# Patient Record
Sex: Male | Born: 1970 | Race: White | Hispanic: No | Marital: Married | Smoking: Never smoker
Health system: Southern US, Community
[De-identification: ages and names within clinical notes are randomized; demographics above are authoritative.]

## PROBLEM LIST (undated history)

## (undated) DIAGNOSIS — G4730 Sleep apnea, unspecified: Secondary | ICD-10-CM

## (undated) DIAGNOSIS — T7840XA Allergy, unspecified, initial encounter: Secondary | ICD-10-CM

## (undated) DIAGNOSIS — J302 Other seasonal allergic rhinitis: Secondary | ICD-10-CM

## (undated) HISTORY — DX: Allergy, unspecified, initial encounter: T78.40XA

## (undated) HISTORY — PX: SHOULDER SURGERY: SHX246

## (undated) HISTORY — PX: BACK SURGERY: SHX140

---

## 2006-12-11 HISTORY — PX: CERVICAL FUSION: SHX112

## 2010-06-09 ENCOUNTER — Ambulatory Visit: Payer: Self-pay | Admitting: Emergency Medicine

## 2010-06-09 DIAGNOSIS — S8010XA Contusion of unspecified lower leg, initial encounter: Secondary | ICD-10-CM | POA: Insufficient documentation

## 2010-09-18 ENCOUNTER — Ambulatory Visit: Payer: Self-pay | Admitting: Family Medicine

## 2010-09-18 DIAGNOSIS — M25569 Pain in unspecified knee: Secondary | ICD-10-CM | POA: Insufficient documentation

## 2011-01-10 NOTE — Assessment & Plan Note (Signed)
Summary: R Knee Pain x 2 dys rm 3   Vital Signs:  Patient Profile:   40 Years Old Male CC:      R knee pain x 2dys Height:     74 inches Weight:      237 pounds O2 Sat:      100 % O2 treatment:    Room Air Temp:     98.6 degrees F oral Pulse rate:   64 / minute Pulse rhythm:   regular Resp:     16 per minute BP sitting:   145 / 92  (left arm) Cuff size:   regular  Vitals Entered By: Areta Haber CMA (September 18, 2010 11:55 AM)                  Current Allergies: No known allergies History of Present Illness Chief Complaint: R knee pain x 2dys History of Present Illness: 40 yo M here with right knee pain.  Patient reports about 2 days ago [place] stepped [place] from one of the ties - felt sharp pain medial right knee shooting down right leg.  Difficult to walk since then.  + swelling in right knee but no bruising.  No true catching or locking but feels like is going to give out.  Has been using ace wrap and taking advil.  No prior surgeries this knee.  Takes rare vicodin and flexeril for neck pain and already has some of this at home.  Current Problems: KNEE PAIN, RIGHT (ICD-719.46) CONTUSION, LOWER LEG, LEFT (ICD-924.10)   Current Meds ADVIL 200 MG TABS (IBUPROFEN) 2-3 prn HYDROCODONE-ACETAMINOPHEN 5-325 MG TABS (HYDROCODONE-ACETAMINOPHEN) as needed for neck pain FLEXERIL 10 MG TABS (CYCLOBENZAPRINE HCL) as needed MELOXICAM 15 MG TABS (MELOXICAM) 1 tab by mouth daily with food  REVIEW OF SYSTEMS Constitutional Symptoms      Denies fever, chills, night sweats, weight loss, weight gain, and fatigue.  Eyes       Denies change in vision, eye pain, eye discharge, glasses, contact lenses, and eye surgery. Ear/Nose/Throat/Mouth       Denies hearing loss/aids, change in hearing, ear pain, ear discharge, dizziness, frequent runny nose, frequent nose bleeds, sinus problems, sore throat, hoarseness, and tooth pain or bleeding.  Respiratory  Denies dry cough, productive cough, wheezing, shortness of breath, asthma, bronchitis, and emphysema/COPD.  Cardiovascular       Denies murmurs, chest pain, and tires easily with exhertion.    Gastrointestinal       Denies stomach pain, nausea/vomiting, diarrhea, constipation, blood in bowel movements, and indigestion. Genitourniary       Denies painful urination, kidney stones, and loss of urinary control. Neurological       Denies paralysis, seizures, and fainting/blackouts. Musculoskeletal       Complains of muscle pain and decreased range of motion.      Denies joint pain, joint stiffness, redness, swelling, muscle weakness, and gout.      Comments: R Knee pain x 2 dys Skin       Denies bruising, unusual mles/lumps or sores, and hair/skin or nail changes.  Psych       Denies mood changes, temper/anger issues, anxiety/stress, speech problems, depression, and sleep problems. Other Comments: Pt states he was stepping off of a step and injuried R knee x 2 dys.    Past History:  Past Medical History: Last updated: 06/09/2010 Unremarkable  Past Surgical History: Last updated: 06/09/2010 Neck 2008  Family History: Last  updated: 06/09/2010 Mother, A fib Father, Healthy  Social History: Last updated: 06/09/2010 Chews 1/2 can a day 7 yrs ETOH-non No DRugs Heating and Air Physical Exam General appearance: well developed, well nourished, no acute distress R knee: No gross deformity, swelling, or bruising. TTP medial joint line.  Minimal lateral joint line TTP. ROM 0-100 degrees and pain with flexion. Stable to valgus and varus stress.  Negative lachmanns, ant/post drawers. + mcmurrays medially. Negative clarkes and patellar apprehension. Assessment New Problems: KNEE PAIN, RIGHT (ICD-719.46)   Plan New Medications/Changes: MELOXICAM 15 MG TABS (MELOXICAM) 1 tab by mouth daily with food  #30 x 1, 09/18/2010, Norton Blizzard MD  New Orders: T-*Unlisted Diagnostic X-ray  test/procedure [id number] New Patient Level III [99203] Knee Sleeve [L1825] Planning Comments:   Likely medial meniscal contusion vs tear.  X-rays negative for bony abnormalities.  Mobic, knee sleeve for comfort and support.  Icing.  See instructions for further.  F/u in 1-2 weeks for reevaluation or sooner if mechanical symptoms.   The patient and/or caregiver has been counseled thoroughly with regard to medications prescribed including dosage, schedule, interactions, rationale for use, and possible side effects and they verbalize understanding.  Diagnoses and expected course of recovery discussed and will return if not improved as expected or if the condition worsens. Patient and/or caregiver verbalized understanding.  Prescriptions: MELOXICAM 15 MG TABS (MELOXICAM) 1 tab by mouth daily with food  #30 x 1   Entered and Authorized by:   Norton Blizzard MD   Signed by:   Norton Blizzard MD on 09/18/2010   Method used:   Electronically to        [facility] [phone]* (retail)       [address]       [place], [place]  [address]       Ph: [phone]       Fax: [phone]   RxID:   [id number]   Patient Instructions: 1)  You have either a bruise on the inside of your knee or a meniscal tear based on your exam today. 2)  Treatment involves ice 15 minutes at a time 3-4 times a day 3)  Take meloxicam once daily with food for pain and inflammation. 4)  Ok to take your vicodin and/or flexeril with this for pain and spasms. 5)  Knee sleeve or brace if you need more support/stability. 6)  Ok to use an ACE bandage instead. 7)  Follow up in my office in 10-14 days for a recheck if not improving (we can always do an MRI sooner but would be more cost effective to wait and see). 8)  If you get catching, locking of your knee, call me sooner.  Orders Added: 1)  T-*Unlisted Diagnostic X-ray test/procedure [76283] 2)  New Patient Level III [15176] 3)  Knee Sleeve [L1825]
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
## 2011-11-02 ENCOUNTER — Emergency Department (HOSPITAL_BASED_OUTPATIENT_CLINIC_OR_DEPARTMENT_OTHER)
Admission: EM | Admit: 2011-11-02 | Discharge: 2011-11-03 | Discharge status: Against Medical Advice | Payer: 59 | Attending: Emergency Medicine | Admitting: Emergency Medicine

## 2011-11-02 DIAGNOSIS — R21 Rash and other nonspecific skin eruption: Secondary | ICD-10-CM | POA: Insufficient documentation

## 2011-11-02 HISTORY — DX: Other seasonal allergic rhinitis: J30.2

## 2011-11-03 ENCOUNTER — Encounter: Payer: Self-pay | Admitting: *Deleted

## 2011-11-03 NOTE — ED Notes (Signed)
Pt voiced concerns about wait times explained delay will continue to monitor

## 2011-11-03 NOTE — ED Notes (Signed)
Pt c/o rash to both legs and arms

## 2011-11-03 NOTE — ED Provider Notes (Signed)
Pt left before full evaluation  Joya Gaskins, MD 11/03/11 [phone]

## 2011-11-03 NOTE — ED Notes (Signed)
Pt came out of room states that he is leaving explained delay encouraged pt to remain and be seen pt refused

## 2012-01-11 ENCOUNTER — Encounter: Payer: Self-pay | Admitting: Emergency Medicine

## 2012-01-11 ENCOUNTER — Emergency Department
Admission: EM | Admit: 2012-01-11 | Discharge: 2012-01-11 | Disposition: A | Discharge status: Home / Self Care | Payer: 59 | Source: Home / Self Care | Attending: Emergency Medicine | Admitting: Emergency Medicine

## 2012-01-11 DIAGNOSIS — J209 Acute bronchitis, unspecified: Secondary | ICD-10-CM

## 2012-01-11 DIAGNOSIS — J069 Acute upper respiratory infection, unspecified: Secondary | ICD-10-CM

## 2012-01-11 DIAGNOSIS — R05 Cough: Secondary | ICD-10-CM

## 2012-01-11 DIAGNOSIS — R062 Wheezing: Secondary | ICD-10-CM

## 2012-01-11 DIAGNOSIS — R059 Cough, unspecified: Secondary | ICD-10-CM

## 2012-01-11 MED ORDER — AZITHROMYCIN 250 MG PO TABS: ORAL_TABLET | ORAL | Status: AC

## 2012-01-11 MED ORDER — GUAIFENESIN-CODEINE 100-10 MG/5ML PO SYRP: 5.0000 mL | ORAL_SOLUTION | Freq: Four times a day (QID) | ORAL | Status: AC | PRN

## 2012-01-11 NOTE — ED Notes (Signed)
Productive cough x 2 days green mucus

## 2012-01-11 NOTE — ED Provider Notes (Signed)
History     CSN: [id number]  Arrival date & time 01/11/12  1913   First MD Initiated Contact with Patient 01/11/12 1918      No chief complaint on file.   (Consider location/radiation/quality/duration/timing/severity/associated sxs/prior treatment) HPI Jason Mack is a 41 y.o. male who complains of onset of cold symptoms for 2 days.  +Sick contacts at home. No sore throat + cough No pleuritic pain No wheezing + nasal congestion + post-nasal drainage + sinus pain/pressure + chest congestion No itchy/red eyes No earache No hemoptysis No SOB No chills/sweats No fever No nausea No vomiting No abdominal pain No diarrhea No skin rashes No fatigue No myalgias No headache    Past Medical History  Diagnosis Date  . Seasonal allergies     Past Surgical History  Procedure Date  . Back surgery     No family history on file.  History  Substance Use Topics  . Smoking status: Never Smoker   . Smokeless tobacco: Not on file  . Alcohol Use: No      Review of Systems  Allergies  Mushroom ext cmplx(shiitake-reishi-mait)  Home Medications  No current outpatient prescriptions on file.  There were no vitals taken for this visit.  Physical Exam  Nursing note and vitals reviewed. Constitutional: He is oriented to person, place, and time. He appears well-developed and well-nourished.  HENT:  Head: Normocephalic and atraumatic.  Right Ear: Tympanic membrane, external ear and ear canal normal.  Left Ear: Tympanic membrane, external ear and ear canal normal.  Nose: Mucosal edema and rhinorrhea present.  Mouth/Throat: Posterior oropharyngeal erythema present. No oropharyngeal exudate or posterior oropharyngeal edema.  Eyes: No scleral icterus.  Neck: Neck supple.  Cardiovascular: Regular rhythm and normal heart sounds.   Pulmonary/Chest: Effort normal. No respiratory distress. He has wheezes (Bilateral). He has rhonchi (Bilateral).  Neurological: He is alert and  oriented to person, place, and time.  Skin: Skin is warm and dry.  Psychiatric: He has a normal mood and affect. His speech is normal.    ED Course  Procedures (including critical care time)  Labs Reviewed - No data to display No results found.   No diagnosis found.    MDM  1)  Take the prescribed antibiotic as instructed.  If worsening, consider CXR. 2)  Use nasal saline solution (over the counter) at least 3 times a day. 3)  Use over the counter decongestants like Zyrtec-D every 12 hours as needed to help with congestion.  If you have hypertension, do not take medicines with sudafed.  4)  Can take tylenol every 6 hours or motrin every 8 hours for pain or fever. 5)  Follow up with your primary doctor if no improvement in 5-7 days, sooner if increasing pain, fever, or new symptoms.     Lily Kocher, MD 01/11/12 [phone]

## 2012-09-26 ENCOUNTER — Emergency Department
Admission: EM | Admit: 2012-09-26 | Discharge: 2012-09-26 | Disposition: A | Discharge status: Home / Self Care | Payer: 59 | Source: Home / Self Care | Attending: Family Medicine | Admitting: Family Medicine

## 2012-09-26 ENCOUNTER — Encounter: Payer: Self-pay | Admitting: *Deleted

## 2012-09-26 DIAGNOSIS — J069 Acute upper respiratory infection, unspecified: Secondary | ICD-10-CM

## 2012-09-26 MED ORDER — CLARITHROMYCIN 500 MG PO TABS: 500.0000 mg | ORAL_TABLET | Freq: Two times a day (BID) | ORAL | Status: DC

## 2012-09-26 MED ORDER — BENZONATATE 200 MG PO CAPS: 200.0000 mg | ORAL_CAPSULE | Freq: Every day | ORAL | Status: DC

## 2012-09-26 NOTE — ED Notes (Signed)
Patient c/o productive cough and congestion x 5 days. Denies fever. Taken tylenol cold and Mucinex OTC. SOB with exertion and CP with cough.

## 2012-09-26 NOTE — ED Provider Notes (Signed)
History     CSN: [id number]  Arrival date & time 09/26/12  1627   First MD Initiated Contact with Patient 09/26/12 1700      Chief Complaint  Patient presents with  . Cough      HPI Comments: Patient complains of approximately 5 day history of gradually progressive URI symptoms beginning with a mild sore throat (now improved), followed by progressive nasal congestion.  A cough started about 3 days ago.  Complains of fatigue and initial myalgias.  Cough is now worse at night and generally non-productive during the day.  There has been no pleuritic pain, shortness of breath, or wheezes.  He does complain of tightness in his anterior chest.  He sometimes coughs until he gags.  He recalls that his last Tdap was in 2008.  The history is provided by the patient.    Past Medical History  Diagnosis Date  . Seasonal allergies     Past Surgical History  Procedure Date  . Back surgery     History reviewed. No pertinent family history.  History  Substance Use Topics  . Smoking status: Never Smoker   . Smokeless tobacco: Current User  . Alcohol Use: No      Review of Systems + sore throat, resolved + cough No pleuritic pain, but has tightness in anterior chest No wheezing + nasal congestion + post-nasal drainage No sinus pain/pressure No itchy/red eyes No earache No hemoptysis No SOB No fever/chills No nausea No vomiting No abdominal pain No diarrhea No urinary symptoms No skin rashes + fatigue + myalgias No headache Used OTC meds without relief  Allergies  Mushroom ext cmplx(shiitake-reishi-mait)  Home Medications   Current Outpatient Rx  Name Route Sig Dispense Refill  . BENZONATATE 200 MG PO CAPS Oral Take 1 capsule (200 mg total) by mouth at bedtime. Take as needed for cough 12 capsule 0  . CLARITHROMYCIN 500 MG PO TABS Oral Take 1 tablet (500 mg total) by mouth 2 (two) times daily. 14 tablet 0    BP 119/83  Pulse 99  Temp 98.3 F (36.8 C) (Oral)   Resp 14  Ht 6\' 2"  (1.88 m)  Wt 230 lb (104.327 kg)  BMI 29.53 kg/m2  SpO2 96%  Physical Exam Nursing notes and Vital Signs reviewed. Appearance:  Patient appears healthy, stated age, and in no acute distress Eyes:  Pupils are equal, round, and reactive to light and accomodation.  Extraocular movement is intact.  Conjunctivae are not inflamed  Ears:  Canals normal.  Tympanic membranes normal.  Nose:  Mildly congested turbinates.  No sinus tenderness.  Pharynx:  Normal Neck:  Supple.  Slightly prominent shotty posterior nodes are palpated bilaterally  Lungs:   Faint rhonchi right posterior chest; no rales.  Breath sounds are equal.   Chest:  No tenderness over sternum Heart:  Regular rate and rhythm without murmurs, rubs, or gallops.  Abdomen:  Nontender without masses or hepatosplenomegaly.  Bowel sounds are present.  No CVA or flank tenderness.  Extremities:  No edema.  No calf tenderness Skin:  No rash present.   ED Course  Procedures  none      1. Acute upper respiratory infections of unspecified site; ?pertussis       MDM  Begin Biaxin.  Prescription written for Benzonatate [facility]) to take at bedtime for night-time cough.  Continue Mucinex D (guaifenesin with decongestant) twice daily for congestion.  Increase fluid intake, rest. May use Afrin nasal spray (or generic oxymetazoline)  twice daily for about 5 days.  Also recommend using saline nasal spray several times daily and saline nasal irrigation (AYR is a common brand) Stop all antihistamines for now, and other non-prescription cough/cold preparations. Recommend Tdap when well. Follow-up with family doctor if not improving 7 to 10 days.         Lattie Haw, MD 09/26/12 1840

## 2013-04-08 ENCOUNTER — Emergency Department
Admission: EM | Admit: 2013-04-08 | Discharge: 2013-04-08 | Disposition: A | Discharge status: Home / Self Care | Payer: 59 | Source: Home / Self Care | Attending: Family Medicine | Admitting: Family Medicine

## 2013-04-08 ENCOUNTER — Encounter: Payer: Self-pay | Admitting: *Deleted

## 2013-04-08 DIAGNOSIS — M7702 Medial epicondylitis, left elbow: Secondary | ICD-10-CM

## 2013-04-08 DIAGNOSIS — M7700 Medial epicondylitis, unspecified elbow: Secondary | ICD-10-CM

## 2013-04-08 MED ORDER — KETOROLAC TROMETHAMINE 30 MG/ML IJ SOLN
30.0000 mg | Freq: Once | INTRAMUSCULAR | Status: AC
  Administered 2013-04-08: 30 mg via INTRAMUSCULAR

## 2013-04-08 MED ORDER — MELOXICAM 15 MG PO TABS: 15.0000 mg | ORAL_TABLET | Freq: Every day | ORAL | Status: DC

## 2013-04-08 NOTE — ED Notes (Signed)
Pt c/o LT elbow pain x 1 day after building a sandbox at home. Denies injury. He took Motrin with some relief.

## 2013-04-08 NOTE — ED Provider Notes (Signed)
History     CSN: [id number]  Arrival date & time 04/08/13  1113   First MD Initiated Contact with Patient 04/08/13 1124      Chief Complaint  Patient presents with  . Elbow Pain   HPI  medial elbow pain x 2 days  Pt was putting together swing set when he noticed sudden onset of L elbow pain  Pain worse with wrist extension and mild supination.  Neurovascularly intact distally.  No direct trauma.  Pain mild to moderate in severity.  Pain mildly improved with ibuprofen.  Past Medical History  Diagnosis Date  . Seasonal allergies     Past Surgical History  Procedure Laterality Date  . Back surgery      History reviewed. No pertinent family history.  History  Substance Use Topics  . Smoking status: Never Smoker   . Smokeless tobacco: Current User  . Alcohol Use: Yes      Review of Systems  All other systems reviewed and are negative.    Allergies  Mushroom ext cmplx(shiitake-reishi-mait)  Home Medications   Current Outpatient Rx  Name  Route  Sig  Dispense  Refill  . benzonatate (TESSALON) 200 MG capsule   Oral   Take 1 capsule (200 mg total) by mouth at bedtime. Take as needed for cough   12 capsule   0   . clarithromycin (BIAXIN) 500 MG tablet   Oral   Take 1 tablet (500 mg total) by mouth 2 (two) times daily.   14 tablet   0     BP 134/83  Pulse 54  Temp(Src) 98.3 F (36.8 C) (Oral)  Resp 16  Ht 6\' 2"  (1.88 m)  Wt 220 lb (99.791 kg)  BMI 28.23 kg/m2  SpO2 99%  Physical Exam  Constitutional: He appears well-developed and well-nourished.  HENT:  Head: Normocephalic and atraumatic.  Eyes: Pupils are equal, round, and reactive to light.  Neck: Normal range of motion.  Cardiovascular: Normal rate and regular rhythm.   Pulmonary/Chest: Effort normal.  Abdominal: Soft.  Musculoskeletal:       Arms: Neurological: He is alert.  Skin: Skin is warm.    ED Course  Procedures (including critical care time)  Labs Reviewed - No data to  display No results found.   1. Medial epicondylitis of elbow, left       MDM  Will place in counterforce strap to affected side  RICE and NSAIDs.  Handout given.  Follow up with sports medicine if sxs not improved.     The patient and/or caregiver has been counseled thoroughly with regard to treatment plan and/or medications prescribed including dosage, schedule, interactions, rationale for use, and possible side effects and they verbalize understanding. Diagnoses and expected course of recovery discussed and will return if not improved as expected or if the condition worsens. Patient and/or caregiver verbalized understanding.             Doree Albee, MD 04/08/13 1235

## 2013-09-24 ENCOUNTER — Emergency Department (INDEPENDENT_AMBULATORY_CARE_PROVIDER_SITE_OTHER): Payer: 59

## 2013-09-24 ENCOUNTER — Emergency Department
Admission: EM | Admit: 2013-09-24 | Discharge: 2013-09-24 | Disposition: A | Discharge status: Home / Self Care | Payer: 59 | Source: Home / Self Care | Attending: Family Medicine | Admitting: Family Medicine

## 2013-09-24 ENCOUNTER — Encounter: Payer: Self-pay | Admitting: Emergency Medicine

## 2013-09-24 DIAGNOSIS — M19079 Primary osteoarthritis, unspecified ankle and foot: Secondary | ICD-10-CM

## 2013-09-24 DIAGNOSIS — M109 Gout, unspecified: Secondary | ICD-10-CM

## 2013-09-24 MED ORDER — PREDNISONE 20 MG PO TABS: 20.0000 mg | ORAL_TABLET | Freq: Two times a day (BID) | ORAL | Status: DC

## 2013-09-24 NOTE — ED Provider Notes (Signed)
CSN: [id number]     Arrival date & time 09/24/13  1237 History   First MD Initiated Contact with Patient 09/24/13 1308     Chief Complaint  Patient presents with  . Toe Pain      HPI Comments: Yesterday evening about 7pm patient felt pain in his right great toe.  Upon arriving home and taking off his shoe he noticed that his right first MTP joint was red, painful, and swollen.  He recalls no preceding injury.  No fevers, chills, and sweats.  He feels well otherwise.  No past history or family history of gout.  Patient is a 42 y.o. male presenting with toe pain. The history is provided by the patient.  Toe Pain This is a new problem. The current episode started yesterday. The problem occurs constantly. The problem has been gradually worsening. Associated symptoms comments: nne. The symptoms are aggravated by walking and standing (flexing toe). Nothing relieves the symptoms. Treatments tried: ibuprofen. The treatment provided no relief.    Past Medical History  Diagnosis Date  . Seasonal allergies    Past Surgical History  Procedure Laterality Date  . Back surgery     Family History  Problem Relation Age of Onset  . Diabetes Father    History  Substance Use Topics  . Smoking status: Never Smoker   . Smokeless tobacco: Current User  . Alcohol Use: Yes    Review of Systems  All other systems reviewed and are negative.    Allergies  Mushroom ext cmplx(shiitake-reishi-mait)  Home Medications   Current Outpatient Rx  Name  Route  Sig  Dispense  Refill  . predniSONE (DELTASONE) 20 MG tablet   Oral   Take 1 tablet (20 mg total) by mouth 2 (two) times daily.   10 tablet   0    BP 126/86  Pulse 61  Temp(Src) 98.3 F (36.8 C) (Oral)  Resp 18  Wt 226 lb (102.513 kg)  BMI 29 kg/m2  SpO2 98% Physical Exam  Nursing note and vitals reviewed. Constitutional: He is oriented to person, place, and time. He appears well-developed and well-nourished. No distress.  HENT:    Head: Normocephalic.  Eyes: Conjunctivae are normal. Pupils are equal, round, and reactive to light.  Musculoskeletal:       Feet:  Right great toe MTP joint is erythematous, warm, slightly swollen, and tender to palpation.  No lesions noted.  Distal neurovascular function is intact.  The first toe has decreased range of motion   Neurological: He is alert and oriented to person, place, and time.  Skin: Skin is warm and dry.    ED Course  Procedures  none    Labs Reviewed  URIC ACID   Imaging Review Dg Toe Great Right  09/24/2013   CLINICAL DATA:  First toe pain, swelling and redness.  EXAM: RIGHT GREAT TOE  COMPARISON:  None.  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 No acute osseous abnormality.  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   Electronically Signed   By: Leanna Battles M.D.   On: 09/24/2013 13:57      MDM   1. Acute gout    Uric acid pending Begin prednisone burst. Increase fluid intake.  May take Tylenol for pain. Followup with Family Doctor if not improved in one week.     Lattie Haw, MD 09/24/13 1440

## 2013-09-24 NOTE — ED Notes (Signed)
Pt c/o RT great toe pain, swelling, redness and warm to the touch x last night. Denies injury. Denies hx of gout. He has taken IBF.

## 2013-09-25 LAB — URIC ACID: Uric Acid, Serum: 6.9 mg/dL (ref 4.0–7.8)

## 2014-10-23 ENCOUNTER — Emergency Department
Admission: EM | Admit: 2014-10-23 | Discharge: 2014-10-23 | Disposition: A | Discharge status: Home / Self Care | Payer: 59 | Source: Home / Self Care | Attending: Family Medicine | Admitting: Family Medicine

## 2014-10-23 ENCOUNTER — Encounter: Payer: Self-pay | Admitting: *Deleted

## 2014-10-23 DIAGNOSIS — R110 Nausea: Secondary | ICD-10-CM

## 2014-10-23 DIAGNOSIS — R112 Nausea with vomiting, unspecified: Secondary | ICD-10-CM

## 2014-10-23 LAB — POCT CBC W AUTO DIFF (K'VILLE URGENT CARE)

## 2014-10-23 LAB — POCT URINALYSIS DIPSTICK
BILIRUBIN UA: NEGATIVE
Glucose, UA: NEGATIVE
Ketones, UA: NEGATIVE
LEUKOCYTES UA: NEGATIVE
Nitrite, UA: NEGATIVE
Protein, UA: NEGATIVE
Spec Grav, UA: 1.02 (ref 1.005–1.03)
Urobilinogen, UA: 0.2 (ref 0–1)
pH, UA: 5.5 (ref 5–8)

## 2014-10-23 MED ORDER — ONDANSETRON 4 MG PO TBDP: ORAL_TABLET | ORAL | Status: DC

## 2014-10-23 NOTE — Discharge Instructions (Signed)
Begin clear liquids (Pedialyte if diarrhea develops) until improved, then advance to a [employer] (Bananas, Rice, Applesauce, Toast).  Then gradually resume a regular diet when tolerated.   If symptoms become significantly worse during the night or over the weekend, proceed to the local emergency room.

## 2014-10-23 NOTE — ED Provider Notes (Signed)
CSN: [id number]     Arrival date & time 10/23/14  1057 History   First MD Initiated Contact with Patient 10/23/14 1300     Chief Complaint  Patient presents with  . Abdominal Cramping  . Nausea      HPI Comments: Patient awoke at 2:30am today with upper abdominal cramping, chills, headache, fatigue, and nausea/vomiting. He has also had loose stools, now decreased.  He has had 10 to 15 episodes vomiting, now decreased.  The history is provided by the patient.    Past Medical History  Diagnosis Date  . Seasonal allergies    Past Surgical History  Procedure Laterality Date  . Back surgery     Family History  Problem Relation Age of Onset  . Diabetes Father    History  Substance Use Topics  . Smoking status: Never Smoker   . Smokeless tobacco: Current User  . Alcohol Use: Yes    Review of Systems No sore throat No cough No pleuritic pain No wheezing No nasal congestion No post-nasal drainage No sinus pain/pressure No itchy/red eyes No earache No hemoptysis No SOB No fever, + chills + nausea + vomiting + abdominal pain + diarrhea No urinary symptoms No skin rash + fatigue No myalgias No headache Used OTC meds without relief  Allergies  Mushroom ext cmplx(shiitake-reishi-mait)  Home Medications   Prior to Admission medications   Medication Sig Start Date End Date Taking? Authorizing Provider  ondansetron (ZOFRAN ODT) 4 MG disintegrating tablet Take one tab by mouth Q6hr prn nausea 10/23/14   Kandra Nicolas, MD   BP 125/87 mmHg  Pulse 75  Temp(Src) 98.1 F (36.7 C) (Oral)  Resp 14  Wt 234 lb (106.142 kg)  SpO2 97% Physical Exam Nursing notes and Vital Signs reviewed. Appearance:  Patient appears healthy, stated age, and in no acute distress Eyes:  Pupils are equal, round, and reactive to light and accomodation.  Extraocular movement is intact.  Conjunctivae are not inflamed  Ears:  Canals normal.  Tympanic membranes normal.  Nose:   Normal  turbinates.  No sinus tenderness.   Mouth/Pharynx:  Normal; moist mucous membranes  Neck:  Supple.  No adenopathy Lungs:  Clear to auscultation.  Breath sounds are equal.  Heart:  Regular rate and rhythm without murmurs, rubs, or gallops.  Abdomen:   Mild diffuse tenderness without masses or hepatosplenomegaly.  There is mild tenderness over McBurney's point.  No rebound tenderness.  Bowel sounds are present and increased.  No CVA or flank tenderness.  Extremities:  No edema.  No calf tenderness Skin:  No rash present.   ED Course  Procedures  none    Labs Reviewed  POCT CBC W AUTO DIFF ([facility]):  WBC 13.3; LY 16.1; MO 2.6; GR 81.3; Hgb 15.4; Platelets 270   POCT URINALYSIS DIPSTICK:  BLO trace intact, otherwise negative         MDM   1. Nausea and vomiting in adult patient; suspect viral gastroenteritis.  However, must also consider early appendicitis.     Rx for Zofran 4mg  ODT Begin clear liquids (Pedialyte if diarrhea develops) until improved, then advance to a [employer] (Bananas, Rice, Applesauce, Toast).  Then gradually resume a regular diet when tolerated.   Warned patient that if symptoms become significantly worse during the night or over the weekend, proceed to the local emergency room.    Kandra Nicolas, MD 10/27/14 [phone]

## 2014-10-23 NOTE — ED Notes (Signed)
Jason Mack c/o abdominal cramping, nausea and vomiting x this AM.

## 2014-10-26 ENCOUNTER — Telehealth: Payer: Self-pay | Admitting: Emergency Medicine

## 2015-06-23 ENCOUNTER — Emergency Department
Admission: EM | Admit: 2015-06-23 | Discharge: 2015-06-23 | Disposition: A | Discharge status: Home / Self Care | Payer: 59 | Source: Home / Self Care | Attending: Family Medicine | Admitting: Family Medicine

## 2015-06-23 ENCOUNTER — Encounter: Payer: Self-pay | Admitting: *Deleted

## 2015-06-23 DIAGNOSIS — Z23 Encounter for immunization: Secondary | ICD-10-CM

## 2015-06-23 DIAGNOSIS — IMO0001 Reserved for inherently not codable concepts without codable children: Secondary | ICD-10-CM

## 2015-06-23 DIAGNOSIS — L03012 Cellulitis of left finger: Secondary | ICD-10-CM | POA: Diagnosis not present

## 2015-06-23 MED ORDER — TETANUS-DIPHTH-ACELL PERTUSSIS 5-2.5-18.5 LF-MCG/0.5 IM SUSP
0.5000 mL | Freq: Once | INTRAMUSCULAR | Status: AC
  Administered 2015-06-23: 0.5 mL via INTRAMUSCULAR

## 2015-06-23 MED ORDER — DOXYCYCLINE HYCLATE 100 MG PO CAPS: 100.0000 mg | ORAL_CAPSULE | Freq: Two times a day (BID) | ORAL | Status: DC

## 2015-06-23 NOTE — Discharge Instructions (Signed)
Change bandage daily until healed.  Keep wound clean and dry.  Return for worsening symptoms:  Pain, swelling, redness, etc.   Cellulitis Cellulitis is an infection of the skin and the tissue beneath it. The infected area is usually red and tender. Cellulitis occurs most often in the arms and lower legs.  CAUSES  Cellulitis is caused by bacteria that enter the skin through cracks or cuts in the skin. The most common types of bacteria that cause cellulitis are staphylococci and streptococci. SIGNS AND SYMPTOMS   Redness and warmth.  Swelling.  Tenderness or pain.  Fever. DIAGNOSIS  Your health care provider can usually determine what is wrong based on a physical exam. Blood tests may also be done. TREATMENT  Treatment usually involves taking an antibiotic medicine. HOME CARE INSTRUCTIONS   Take your antibiotic medicine as directed by your health care provider. Finish the antibiotic even if you start to feel better.  Keep the infected arm or leg elevated to reduce swelling.  Apply a warm cloth to the affected area up to 4 times per day to relieve pain.  Take medicines only as directed by your health care provider.  Keep all follow-up visits as directed by your health care provider. SEEK MEDICAL CARE IF:   You notice red streaks coming from the infected area.  Your red area gets larger or turns dark in color.  Your bone or joint underneath the infected area becomes painful after the skin has healed.  Your infection returns in the same area or another area.  You notice a swollen bump in the infected area.  You develop new symptoms.  You have a fever. SEEK IMMEDIATE MEDICAL CARE IF:   You feel very sleepy.  You develop vomiting or diarrhea.  You have a general ill feeling (malaise) with muscle aches and pains. MAKE SURE YOU:   Understand these instructions.  Will watch your condition.  Will get help right away if you are not doing well or get worse. Document  Released: 09/06/2005 Document Revised: 04/13/2014 Document Reviewed: 02/12/2012 [facility] Patient Information 2015 [place], [place]. This information is not intended to replace advice given to you by your health care provider. Make sure you discuss any questions you have with your health care provider.

## 2015-06-23 NOTE — ED Notes (Signed)
Pt c/o LT 2nd finger laceration x 4 days ago, with swelling and d/c now. Tdap unknown.

## 2015-06-23 NOTE — ED Provider Notes (Signed)
CSN: [id number]     Arrival date & time 06/23/15  2637 History   First MD Initiated Contact with Patient 06/23/15 [phone]     Chief Complaint  Patient presents with  . Extremity Laceration      HPI Comments: Patient scraped the dorsal surface of his left second finger PIP joint 4 days ago.  Over the past 24 hours he has developed mildly increasing soreness extending to the MCP joint.  No fevers, chills, and sweats.  He does not remember his last Tdap  Patient is a 44 y.o. male presenting with hand pain. The history is provided by the patient.  Hand Pain This is a new problem. Episode onset: 4 days ago. The problem occurs constantly. The problem has been gradually worsening. Associated symptoms comments: Mild swelling and redness. Exacerbated by: Flexing finger. Nothing relieves the symptoms. Treatments tried: bandage. The treatment provided no relief.    Past Medical History  Diagnosis Date  . Seasonal allergies    Past Surgical History  Procedure Laterality Date  . Back surgery     Family History  Problem Relation Age of Onset  . Diabetes Father    History  Substance Use Topics  . Smoking status: Never Smoker   . Smokeless tobacco: Current User  . Alcohol Use: Yes    Review of Systems  All other systems reviewed and are negative.   Allergies  Mushroom ext cmplx(shiitake-reishi-mait)  Home Medications   Prior to Admission medications   Medication Sig Start Date End Date Taking? Authorizing Provider  doxycycline (VIBRAMYCIN) 100 MG capsule Take 1 capsule (100 mg total) by mouth 2 (two) times daily. Take with food. 06/23/15   Kandra Nicolas, MD   BP 137/89 mmHg  Pulse 59  Temp(Src) 98.2 F (36.8 C) (Oral)  Resp 16  Ht 6\' 2"  (1.88 m)  Wt 236 lb (107.049 kg)  BMI 30.29 kg/m2  SpO2 98% Physical Exam  Constitutional: He is oriented to person, place, and time. He appears well-developed and well-nourished. No distress.  HENT:  Head: Atraumatic.  Eyes: Pupils are equal,  round, and reactive to light.  Musculoskeletal:       Left hand: He exhibits tenderness. He exhibits normal range of motion, no bony tenderness, normal two-point discrimination, normal capillary refill, no deformity, no laceration and no swelling. Normal sensation noted.       Hands: The dorsal surface of the left second finger PIP joint has a 10mm diameter superficial abrasion with mild surrounding erythema and tenderness.  No purulent drainage from wound. No tenderness to palpation of the PIP joint or MCP joint.  Neurological: He is alert and oriented to person, place, and time.  Skin: Skin is warm and dry.    ED Course  Procedures  none  MDM   1. Cellulitis of second finger, left    Tdap administered. Wound culture pending. Begin Doxycycline 100mg  BID for staph coverage. Change bandage daily until healed.  Keep wound clean and dry.  Return for worsening symptoms:  Pain, swelling, redness, etc.    Kandra Nicolas, MD 06/23/15 1006

## 2015-06-27 ENCOUNTER — Telehealth: Payer: Self-pay | Admitting: Emergency Medicine

## 2015-06-27 LAB — WOUND CULTURE
GRAM STAIN: NONE SEEN
GRAM STAIN: NONE SEEN
Gram Stain: NONE SEEN

## 2016-02-20 ENCOUNTER — Emergency Department
Admission: EM | Admit: 2016-02-20 | Discharge: 2016-02-20 | Disposition: A | Discharge status: Home / Self Care | Payer: 59 | Source: Home / Self Care | Attending: Family Medicine | Admitting: Family Medicine

## 2016-02-20 ENCOUNTER — Encounter: Payer: Self-pay | Admitting: Emergency Medicine

## 2016-02-20 ENCOUNTER — Emergency Department (INDEPENDENT_AMBULATORY_CARE_PROVIDER_SITE_OTHER): Payer: 59

## 2016-02-20 DIAGNOSIS — R509 Fever, unspecified: Secondary | ICD-10-CM

## 2016-02-20 DIAGNOSIS — R918 Other nonspecific abnormal finding of lung field: Secondary | ICD-10-CM | POA: Diagnosis not present

## 2016-02-20 DIAGNOSIS — R0989 Other specified symptoms and signs involving the circulatory and respiratory systems: Secondary | ICD-10-CM

## 2016-02-20 DIAGNOSIS — R0602 Shortness of breath: Secondary | ICD-10-CM | POA: Diagnosis not present

## 2016-02-20 DIAGNOSIS — J189 Pneumonia, unspecified organism: Secondary | ICD-10-CM

## 2016-02-20 LAB — POCT INFLUENZA A/B
Influenza A, POC: NEGATIVE
Influenza B, POC: NEGATIVE

## 2016-02-20 MED ORDER — HYDROCODONE-HOMATROPINE 5-1.5 MG/5ML PO SYRP: 5.0000 mL | ORAL_SOLUTION | Freq: Four times a day (QID) | ORAL | Status: DC | PRN

## 2016-02-20 MED ORDER — IBUPROFEN 800 MG PO TABS: 800.0000 mg | ORAL_TABLET | Freq: Once | ORAL | Status: DC

## 2016-02-20 MED ORDER — DOXYCYCLINE HYCLATE 100 MG PO CAPS: 100.0000 mg | ORAL_CAPSULE | Freq: Two times a day (BID) | ORAL | Status: DC

## 2016-02-20 NOTE — Discharge Instructions (Signed)
You may take 400-600mg  Ibuprofen (Motrin) every 6-8 hours for fever and pain  Alternate with Tylenol  You may take 500mg  Tylenol every 4-6 hours as needed for fever and pain  Follow-up with your primary care provider next week for recheck of symptoms if not improving.  Be sure to drink plenty of fluids and rest, at least 8hrs of sleep a night, preferably more while you are sick. Return urgent care or go to closest ER if you cannot keep down fluids/signs of dehydration, fever not reducing with Tylenol, difficulty breathing/wheezing, stiff neck, worsening condition, or other concerns (see below)  Please take antibiotics as prescribed and be sure to complete entire course even if you start to feel better to ensure infection does not come back.  Hycodan is a narcotic pain and cough medication.  While taking, do not drink alcohol, drive, or perform any other activities that requires focus while taking these medications.    Community-Acquired Pneumonia, Adult Pneumonia is an infection of the lungs. One type of pneumonia can happen while a person is in a hospital. A different type can happen when a person is not in a hospital (community-acquired pneumonia). It is easy for this kind to spread from person to person. It can spread to you if you breathe near an infected person who coughs or sneezes. Some symptoms include:  A dry cough.  A wet (productive) cough.  Fever.  Sweating.  Chest pain. HOME CARE  Take over-the-counter and prescription medicines only as told by your doctor.  Only take cough medicine if you are losing sleep.  If you were prescribed an antibiotic medicine, take it as told by your doctor. Do not stop taking the antibiotic even if you start to feel better.  Sleep with your head and neck raised (elevated). You can do this by putting a few pillows under your head, or you can sleep in a recliner.  Do not use tobacco products. These include cigarettes, chewing tobacco, and  e-cigarettes. If you need help quitting, ask your doctor.  Drink enough water to keep your pee (urine) clear or pale yellow. A shot (vaccine) can help prevent pneumonia. Shots are often suggested for:  People older than 44 years of age.  People older than 44 years of age:  Who are having cancer treatment.  Who have long-term (chronic) lung disease.  Who have problems with their body's defense system (immune system). You may also prevent pneumonia if you take these actions:  Get the flu (influenza) shot every year.  Go to the dentist as often as told.  Wash your hands often. If soap and water are not available, use hand sanitizer. GET HELP IF:  You have a fever.  You lose sleep because your cough medicine does not help. GET HELP RIGHT AWAY IF:  You are short of breath and it gets worse.  You have more chest pain.  Your sickness gets worse. This is very serious if:  You are an older adult.  Your body's defense system is weak.  You cough up blood.   This information is not intended to replace advice given to you by your health care provider. Make sure you discuss any questions you have with your health care provider.   Document Released: 05/15/2008 Document Revised: 08/18/2015 Document Reviewed: 03/24/2015 Elsevier Interactive Patient Education [employer].

## 2016-02-20 NOTE — ED Provider Notes (Signed)
CSN: [id number]     Arrival date & time 02/20/16  1212 History   First MD Initiated Contact with Patient 02/20/16 1311     Chief Complaint  Patient presents with  . URI   (Consider location/radiation/quality/duration/timing/severity/associated sxs/prior Treatment) HPI  The pt is a 44yo male presenting to [facility] with c/o sudden onset chest congestion, chest tightness, SOB, body aches.  Pt states he feels drained, subjective fever with hot and cold.  He has taken dayquil, nyquil, and mucinex but no relief.  No sick contacts or recent travel. He did not receive the flu vaccine this year. Denies n/v/d. Denies hx of asthma.    Past Medical History  Diagnosis Date  . Seasonal allergies    Past Surgical History  Procedure Laterality Date  . Back surgery     Family History  Problem Relation Age of Onset  . Diabetes Father    Social History  Substance Use Topics  . Smoking status: Never Smoker   . Smokeless tobacco: Current User  . Alcohol Use: Yes    Review of Systems  Constitutional: Positive for fever, chills, appetite change and fatigue.  HENT: Positive for congestion, ear pain, postnasal drip, rhinorrhea, sinus pressure, sneezing, sore throat and voice change. Negative for trouble swallowing.   Respiratory: Positive for cough, chest tightness and shortness of breath.   Cardiovascular: Positive for chest pain. Negative for palpitations.  Gastrointestinal: Negative for nausea, vomiting, abdominal pain and diarrhea.  Musculoskeletal: Positive for myalgias and arthralgias. Negative for back pain.  Skin: Negative for rash.  Neurological: Positive for headaches. Negative for dizziness and light-headedness.    Allergies  Mushroom ext cmplx(shiitake-reishi-mait)  Home Medications   Prior to Admission medications   Medication Sig Start Date End Date Taking? Authorizing Provider  doxycycline (VIBRAMYCIN) 100 MG capsule Take 1 capsule (100 mg total) by mouth 2 (two) times daily. One po  bid x 7 days 02/20/16   Noland Fordyce, PA-C  HYDROcodone-homatropine [facility]) 5-1.5 MG/5ML syrup Take 5 mLs by mouth every 6 (six) hours as needed. 02/20/16   Noland Fordyce, PA-C   Meds Ordered and Administered this Visit   Medications  ibuprofen (ADVIL,MOTRIN) tablet 800 mg (not administered)    BP 137/93 mmHg  Pulse 84  Temp(Src) 100.3 F (37.9 C) (Oral)  Ht 6\' 2"  (1.88 m)  Wt 232 lb 12 oz (105.575 kg)  BMI 29.87 kg/m2  SpO2 97% No data found.   Physical Exam  Constitutional: He appears well-developed and well-nourished. He has a sickly appearance.  Pt lying on exam bed, appears fatigued and acutely ill but is alert.   HENT:  Head: Normocephalic and atraumatic.  Right Ear: Tympanic membrane normal.  Left Ear: Tympanic membrane normal.  Nose: Rhinorrhea present.  Mouth/Throat: Uvula is midline, oropharynx is clear and moist and mucous membranes are normal.  Eyes: Conjunctivae are normal. No scleral icterus.  Watery eyes   Neck: Normal range of motion. Neck supple.  Cardiovascular: Normal rate, regular rhythm and normal heart sounds.   Pulmonary/Chest: Effort normal. No respiratory distress. He has decreased breath sounds. He has no wheezes. He has rhonchi. He has no rales. He exhibits no tenderness.  Faint rhonchi in lower lung fields bilaterally.   Abdominal: Soft. Bowel sounds are normal. He exhibits no distension and no mass. There is no tenderness. There is no rebound and no guarding.  Musculoskeletal: Normal range of motion.  Skin: Skin is warm and dry. He is not diaphoretic.  Nursing note and vitals  reviewed.   ED Course  Procedures (including critical care time)  Labs Review Labs Reviewed  POCT INFLUENZA A/B    Imaging Review Dg Chest 2 View  02/20/2016  CLINICAL DATA:  Chest congestion, shortness of breath, fever for 1 day EXAM: CHEST  2 VIEW COMPARISON:  None. FINDINGS: Airspace opacity at the left base concerning for lingular pneumonia. No confluent  opacity on the right. Heart is normal size. No effusion or acute bony abnormality. IMPRESSION: Lingular airspace opacity concerning for pneumonia. Electronically Signed   By: Rolm Baptise M.D.   On: 02/20/2016 13:49     MDM   1. CAP (community acquired pneumonia)    Pt c/o sudden onset flu-like symptoms. Rapid flu- Negative  CXR: c/w pneumonia  Rx: Doxycycline and hycodan.  Advised pt to use acetaminophen and ibuprofen as needed for fever and pain. Encouraged rest and fluids. F/u with PCP in 1 week if not improving, sooner if worsening. Pt verbalized understanding and agreement with tx plan.     Noland Fordyce, PA-C 02/20/16 1504

## 2016-02-20 NOTE — ED Notes (Signed)
Pt c/o chest congestion, hard to take deep breaths, SOB, head congestion, fever x 1 day.

## 2016-02-21 ENCOUNTER — Telehealth: Payer: Self-pay | Admitting: *Deleted

## 2016-02-25 ENCOUNTER — Telehealth: Payer: Self-pay | Admitting: Behavioral Health

## 2016-02-25 NOTE — Telephone Encounter (Signed)
Unable to reach patient at time of Pre-Visit Call.  Left message for patient to return call when available.    

## 2016-02-28 ENCOUNTER — Encounter: Payer: Self-pay | Admitting: Family Medicine

## 2016-02-28 ENCOUNTER — Ambulatory Visit (INDEPENDENT_AMBULATORY_CARE_PROVIDER_SITE_OTHER): Payer: 59 | Admitting: Family Medicine

## 2016-02-28 VITALS — BP 115/80 | HR 96 | Temp 98.0°F | Ht 74.5 in | Wt 233.8 lb

## 2016-02-28 DIAGNOSIS — J189 Pneumonia, unspecified organism: Secondary | ICD-10-CM

## 2016-02-28 DIAGNOSIS — R062 Wheezing: Secondary | ICD-10-CM

## 2016-02-28 MED ORDER — ALBUTEROL SULFATE HFA 108 (90 BASE) MCG/ACT IN AERS: 2.0000 | INHALATION_SPRAY | Freq: Four times a day (QID) | RESPIRATORY_TRACT | Status: DC | PRN

## 2016-02-28 MED FILL — VENTOLIN HFA 90 MCG INHALER: 108 (90 BAS | 30 days supply | Qty: 18 | Fill #0

## 2016-02-28 NOTE — Progress Notes (Signed)
[place] [facility] [address] [place], [place] [address] [phone]  Date:  02/28/2016   Name:  Jason Mack   DOB:  21-Apr-1971   MRN:  [id number]  PCP:  Lamar Blinks, MD    Chief Complaint: Establish Care   History of Present Illness:  Jason Mack is a 45 y.o. very pleasant male patient who presents with the following:  Here today to establish care- [facility] with CAP, treated with doxycycline. He has finished the doxy (treated for 7 days) He continues to feel tired, and his chest is still congested.  He will cough some if he exerts.  He is bringing up some material from his chest but less so.   He is feeling "worn out" still but is overall better He is wheezing some - he has been taking it easy because he will have more sx when he is active. However he does need to get back to his work and plans to do so today Not using the cough syrup any longer  Fevers have resolved over the last 7 days or so No vomiting  He is generally in very good health.  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  He has wheezed on occasion with illness in the past.  He is not using a inhaler now.    Dg Chest 2 View  02/20/2016  CLINICAL DATA:  Chest congestion, shortness of breath, fever for 1 day EXAM: CHEST  2 VIEW COMPARISON:  None. FINDINGS: Airspace opacity at the left base concerning for lingular pneumonia. No confluent opacity on the right. Heart is normal size. No effusion or acute bony abnormality. IMPRESSION: Lingular airspace opacity concerning for pneumonia. Electronically Signed   By: Rolm Baptise M.D.   On: 02/20/2016 13:49     Patient Active Problem List   Diagnosis Date Noted  . KNEE PAIN, RIGHT 09/18/2010  . CONTUSION, LOWER LEG, LEFT 06/09/2010    Past Medical History  Diagnosis Date  . Seasonal allergies   . Allergy      Past Surgical History  Procedure Laterality Date  . Back surgery      Social History  Substance Use Topics  . Smoking status: Never Smoker   . Smokeless tobacco: Current User  . Alcohol Use: Yes    Family History  Problem Relation Age of Onset  . Diabetes Father     Allergies  Allergen Reactions  . Mushroom Ext Cmplx(Shiitake-Reishi-Mait)     Medication list has been reviewed and updated.  Current Outpatient Prescriptions on File Prior to Visit  Medication Sig Dispense Refill  . HYDROcodone-homatropine (HYCODAN) 5-1.5 MG/5ML syrup Take 5 mLs by mouth every 6 (six) hours as needed. 100 mL 0  . doxycycline (VIBRAMYCIN) 100 MG capsule Take 1 capsule (100 mg total) by mouth 2 (two) times daily. One po bid x 7 days 14 capsule 0   No current facility-administered medications on file prior to visit.    Review of Systems:  As per HPI- otherwise negative.  BP Readings from Last 3 Encounters:  02/28/16 100/80  02/20/16 137/93  06/23/15 137/89      Physical Examination: Filed Vitals:   02/28/16 1138  BP: 100/80  Pulse: 96  Temp: 98 F (36.7 C)   Filed Vitals:   02/28/16 1138  Height: 6'  2.5" (1.892 m)  Weight: 233 lb 12.8 oz (106.051 kg)   Body mass index is 29.63 kg/(m^2). Ideal Body Weight: Weight in (lb) to have BMI = 25: 196.9  GEN: WDWN, NAD, Non-toxic, A & O x 3, looks well but tired HEENT: Atraumatic, Normocephalic. Neck supple. No masses, No LAD. Ears and Nose: No external deformity. CV: RRR, No M/G/R. No JVD. No thrill. No extra heart sounds. PULM: no wheezes, crackles but he does have mild left base rhonchi. No retractions. No resp. distress. No accessory muscle use. ABD: S, NT, ND, +BS. No rebound. No HSM.   EXTR: No c/c/e NEURO Normal gait.  PSYCH: Normally interactive. Conversant. Not depressed or anxious appearing.  Calm demeanor.    Assessment and Plan: CAP (community acquired pneumonia) - Plan: albuterol (PROVENTIL HFA;VENTOLIN HFA)  108 (90 Base) MCG/ACT inhaler  Wheezing  Recent illness with CAP Improving but not yet well Albuterol to use as needed, anticipatory guidance He will contact me if not improving this week or if not well in 2-3 weeks   Signed Lamar Blinks, MD

## 2016-02-28 NOTE — Patient Instructions (Addendum)
It appears that you are getting better but you are not well yet.   Take it easy and be sure to rest more when you need it Let me know if you do not seem to be improving over this week and we may want to try a few days of prednisone for you For the time being use the inhaler as needed for cough and wheezing  Let me know if you are not back to yourself in the next 2-3 weeks- Sooner if worse.

## 2016-02-28 NOTE — Progress Notes (Signed)
Pre visit review using our clinic review tool, if applicable. No additional management support is needed unless otherwise documented below in the visit note. 

## 2016-04-04 ENCOUNTER — Telehealth: Payer: Self-pay

## 2016-04-05 ENCOUNTER — Encounter: Payer: Self-pay | Admitting: Family Medicine

## 2016-04-05 ENCOUNTER — Ambulatory Visit (INDEPENDENT_AMBULATORY_CARE_PROVIDER_SITE_OTHER): Payer: 59 | Admitting: Family Medicine

## 2016-04-05 VITALS — BP 132/98 | HR 67 | Temp 98.1°F | Ht 74.5 in | Wt 236.2 lb

## 2016-04-05 DIAGNOSIS — Z13220 Encounter for screening for lipoid disorders: Secondary | ICD-10-CM

## 2016-04-05 DIAGNOSIS — Z131 Encounter for screening for diabetes mellitus: Secondary | ICD-10-CM

## 2016-04-05 DIAGNOSIS — Z130 Encounter for screening for diseases of the blood and blood-forming organs and certain disorders involving the immune mechanism: Secondary | ICD-10-CM

## 2016-04-05 DIAGNOSIS — R0683 Snoring: Secondary | ICD-10-CM

## 2016-04-05 DIAGNOSIS — F17200 Nicotine dependence, unspecified, uncomplicated: Secondary | ICD-10-CM

## 2016-04-05 DIAGNOSIS — Z0000 Encounter for general adult medical examination without abnormal findings: Secondary | ICD-10-CM

## 2016-04-05 LAB — COMPREHENSIVE METABOLIC PANEL
ALT: 46 U/L (ref 0–53)
AST: 27 U/L (ref 0–37)
Albumin: 4.4 g/dL (ref 3.5–5.2)
Alkaline Phosphatase: 69 U/L (ref 39–117)
BILIRUBIN TOTAL: 0.8 mg/dL (ref 0.2–1.2)
BUN: 11 mg/dL (ref 6–23)
CHLORIDE: 104 meq/L (ref 96–112)
CO2: 30 mEq/L (ref 19–32)
CREATININE: 1 mg/dL (ref 0.40–1.50)
Calcium: 9.4 mg/dL (ref 8.4–10.5)
GFR: 86.46 mL/min (ref >60.00)
Glucose, Bld: 99 mg/dL (ref 70–99)
Potassium: 3.8 mEq/L (ref 3.5–5.1)
SODIUM: 140 meq/L (ref 135–145)
TOTAL PROTEIN: 7.2 g/dL (ref 6.0–8.3)

## 2016-04-05 LAB — LIPID PANEL
CHOL/HDL RATIO: 4
Cholesterol: 154 mg/dL (ref 0–200)
HDL: 39.9 mg/dL (ref >39.00)
LDL CALC: 99 mg/dL (ref 0–99)
NonHDL: 114.18
TRIGLYCERIDES: 78 mg/dL (ref 0.0–149.0)
VLDL: 15.6 mg/dL (ref 0.0–40.0)

## 2016-04-05 LAB — CBC
HEMATOCRIT: 43.1 % (ref 39.0–52.0)
Hemoglobin: 14.7 g/dL (ref 13.0–17.0)
MCHC: 34.2 g/dL (ref 30.0–36.0)
MCV: 91.4 fl (ref 78.0–100.0)
Platelets: 256 10*3/uL (ref 150.0–400.0)
RBC: 4.71 Mil/uL (ref 4.22–5.81)
RDW: 14.2 % (ref 11.5–15.5)
WBC: 6.8 10*3/uL (ref 4.0–10.5)

## 2016-04-05 LAB — HEMOGLOBIN A1C: HEMOGLOBIN A1C: 5.5 % (ref 4.6–6.5)

## 2016-04-05 NOTE — Progress Notes (Signed)
[facility] [address], [place], [place] [address] [phone]  Date:  04/05/2016   Name:  Jason Mack   DOB:  1971/10/09   MRN:  [id number]  PCP:  Lamar Blinks, MD    Chief Complaint: Annual Exam   History of Present Illness:  Jason Mack is a 45 y.o. very pleasant male patient who presents with the following:  Generally healthy young man here today for a CPE.  I saw him last month for CAP.  He is fully recovered from this illness.   No recent labs, would like to have BW today. He is fasting today for labs  Tetanus is UTD  He is married, in the Hvac business.  They had 21 and 9 yo children at home.  He is generally in good health,but his wife has asked him to come and have a CPE.   He does not smoke, but he does chew tobacco. He has done this on and off for 10 years or so.  Knows that he should quit  His wife is concerned about his snoring and wonders if he might have sleep apnea-he notes that he has not felt well rested really in years,  He would like to have a sleep test His wife is able to check his BP at home Patient Active Problem List   Diagnosis Date Noted  . KNEE PAIN, RIGHT 09/18/2010  . CONTUSION, LOWER LEG, LEFT 06/09/2010    Past Medical History  Diagnosis Date  . Seasonal allergies   . Allergy     Past Surgical History  Procedure Laterality Date  . Back surgery      Social History  Substance Use Topics  . Smoking status: Never Smoker   . Smokeless tobacco: Current User    Types: Chew  . Alcohol Use: 0.0 oz/week    0 Standard drinks or equivalent per week    Family History  Problem Relation Age of Onset  . Diabetes Father     Allergies  Allergen Reactions  . Mushroom Ext Cmplx(Shiitake-Reishi-Mait)     Medication list has been reviewed and updated.  Current Outpatient Prescriptions on File Prior to Visit  Medication Sig Dispense Refill  . albuterol (PROVENTIL  HFA;VENTOLIN HFA) 108 (90 Base) MCG/ACT inhaler Inhale 2 puffs into the lungs every 6 (six) hours as needed for wheezing or shortness of breath. (Patient not taking: Reported on 04/04/2016) 1 Inhaler 0   No current facility-administered medications on file prior to visit.    Review of Systems:  As per HPI- otherwise negative.  BP Readings from Last 3 Encounters:  04/05/16 132/98  02/28/16 115/80  02/20/16 137/93      Physical Examination: Filed Vitals:   04/05/16 0925  BP: 132/98  Pulse: 67  Temp: 98.1 F (36.7 C)   Filed Vitals:   04/05/16 0925  Height: 6' 2.5" (1.892 m)  Weight: 236 lb 3.2 oz (107.14 kg)   Body mass index is 29.93 kg/(m^2). Ideal Body Weight: Weight in (lb) to have BMI = 25: 196.9  GEN: WDWN, NAD, Non-toxic, A & O x 3, mild overweight, looks well HEENT: Atraumatic, Normocephalic. Neck supple. No masses, No LAD.  Bilateral TM wnl, oropharynx normal.  PEERL,EOMI.   Ears and Nose: No external deformity. CV: RRR, No M/G/R. No JVD. No thrill. No extra heart sounds. PULM: CTA B, no wheezes, crackles, rhonchi. No retractions. No resp. distress. No accessory muscle use.  ABD: S, NT, ND.diag . No rebound. No HSM. EXTR: No c/c/e NEURO Normal gait.  PSYCH: Normally interactive. Conversant. Not depressed or anxious appearing.  Calm demeanor.  GU: normal testes and penis   Assessment and Plan: Physical exam  Screening for hyperlipidemia - Plan: Lipid panel  Screening for diabetes mellitus - Plan: Comprehensive metabolic panel, Hemoglobin A1c  Screening for deficiency anemia - Plan: CBC  Snoring - Plan: Ambulatory referral to Sleep Studies  Tobacco use disorder  Encouraged tobacco cessation, will arrange sleep study, labs pending today BP borderlines- this may be due to sleep apnea. He will monitor it at home  Signed Lamar Blinks, MD

## 2016-04-05 NOTE — Patient Instructions (Signed)
It was very nice to see you today- I will be in touch with your labs asap We will arrange for a sleep study for you Please do work on quitting chewing tobacco- this habit can lead to some very unpleasant cancers that you do not want!!   Your BP is a little bit high today-if you do have sleep apnea treating this will generally lower blood pressure.  Please check a few BP readings at home and be in touch with me in a month or so

## 2016-04-05 NOTE — Progress Notes (Signed)
Pre visit review using our clinic tool,if applicable. No additional management support is needed unless otherwise documented below in the visit note.  

## 2016-04-11 ENCOUNTER — Ambulatory Visit (INDEPENDENT_AMBULATORY_CARE_PROVIDER_SITE_OTHER): Payer: 59 | Admitting: Neurology

## 2016-04-11 ENCOUNTER — Encounter: Payer: Self-pay | Admitting: Neurology

## 2016-04-11 VITALS — BP 148/96 | HR 70 | Resp 18 | Ht 74.5 in | Wt 234.0 lb

## 2016-04-11 DIAGNOSIS — R0681 Apnea, not elsewhere classified: Secondary | ICD-10-CM

## 2016-04-11 DIAGNOSIS — R0683 Snoring: Secondary | ICD-10-CM | POA: Diagnosis not present

## 2016-04-11 DIAGNOSIS — K0889 Other specified disorders of teeth and supporting structures: Secondary | ICD-10-CM | POA: Diagnosis not present

## 2016-04-11 DIAGNOSIS — E663 Overweight: Secondary | ICD-10-CM

## 2016-04-11 DIAGNOSIS — G4710 Hypersomnia, unspecified: Secondary | ICD-10-CM

## 2016-04-11 NOTE — Patient Instructions (Signed)

## 2016-04-11 NOTE — Progress Notes (Signed)
Subjective:    Patient ID: Jason Mack is a 45 y.o. male.  HPI     Star Age, MD, PhD [facility] [address], [address].O. [place], [place] [address]  Dear Dr. Lorelei Pont,   I saw your patient, Jason Mack, upon your kind request, in my neurologic clinic today for initial consultation of his sleep disorder, in particular, concern for underlying obstructive sleep apnea. The patient is unaccompanied today. As you know, Jason Mack is a 45 year old right-handed gentleman with an underlying medical history of seasonal allergies, status post cervical spine surgery in 2008 (due to Fx cervical vertebrae), recent pneumonia, smokeless tobacco use, and obesity, who reports snoring and excessive daytime somnolence as well as witnessed breathing pauses while asleep per wife.  His wife is a Marine scientist and has noticed apneic pauses while he is asleep and snoring is worse when he is on his back. He tries to sleep on his sides. He tries to get enough sleep, bedtime around 9:30 to 10 PM and rise time around 6 AM. He denies morning headaches, restless leg symptoms or nocturia. He does not wake up rested. He is tired during the day, symptoms have been ongoing for at least 2 years. He has also slowly gained weight in the past 2 years or so. His father has obstructive sleep apnea and uses a CPAP machine. 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 He lives at home with his wife and 2 children, ages 42 and 92. He drinks 1-2 beer per day, does not use any illicit drugs, drinks about a soda per week, not much caffeine on a daily basis. He chews tobacco, one can per week. Sometimes he jerks his legs during sleep but typically this is with an apnea and with a snort. He is not known to twitch his legs regularly during his sleep. His Epworth sleepiness score is 11 out of 24 today, his fatigue score is 46 out of 63. I reviewed your office note from 04/05/2016.  His Past Medical  History Is Significant For: Past Medical History  Diagnosis Date  . Seasonal allergies   . Allergy     His Past Surgical History Is Significant For: Past Surgical History  Procedure Laterality Date  . Back surgery    . Cervical fusion  2008    His Family History Is Significant For: Family History  Problem Relation Age of Onset  . Diabetes Father   . Heart murmur Mother     His Social History Is Significant For: Social History   Social History  . Marital Status: Married    Spouse Name: N/A  . Number of Children: 2  . Years of Education: College   Occupational History  . [employer]    Social History Main Topics  . Smoking status: Never Smoker   . Smokeless tobacco: Current User    Types: Chew  . Alcohol Use: 0.0 oz/week    0 Standard drinks or equivalent per week  . Drug Use: No  . Sexual Activity: No   Other Topics Concern  . None   Social History Narrative   Drinks 1 soda a week     His Allergies Are:  Allergies  Allergen Reactions  . Mushroom Ext Cmplx(Shiitake-Reishi-Mait)   :   His Current Medications Are:  Outpatient Encounter Prescriptions as of 04/11/2016  Medication Sig  . albuterol (PROVENTIL HFA;VENTOLIN HFA) 108 (90 Base) MCG/ACT inhaler Inhale 2 puffs into the lungs  every 6 (six) hours as needed for wheezing or shortness of breath.   No facility-administered encounter medications on file as of 04/11/2016.  :  Review of Systems:  Out of a complete 14 point review of systems, all are reviewed and negative with the exception of these symptoms as listed below:   Review of Systems  Constitutional: Positive for fatigue.       Weight gain   Neurological:       Patient feels like he sleep very lightly, wakes up feeling tired, snoring, witnessed apnea, daytime tiredness, morning headaches, denies taking naps.   Psychiatric/Behavioral:       Not enough sleep, decreased energy, disinterest in activities    Epworth Sleepiness Scale 0= would never  doze 1= slight chance of dozing 2= moderate chance of dozing 3= high chance of dozing  Sitting and reading:3 Watching TV:3 Sitting inactive in a public place (ex. Theater or meeting):1 As a passenger in a car for an hour without a break:1 Lying down to rest in the afternoon:2 Sitting and talking to someone:0 Sitting quietly after lunch (no alcohol):1 In a car, while stopped in traffic: 0 Total:11  Objective:  Neurologic Exam  Physical Exam Physical Examination:   Filed Vitals:   04/11/16 0858  BP: 148/96  Pulse: 70  Resp: 18   General Examination: The patient is a very pleasant 45 y.o. male in no acute distress. He appears well-developed and well-nourished and very well groomed.   HEENT: Normocephalic, atraumatic, pupils are equal, round and reactive to light and accommodation. Funduscopic exam is normal with sharp disc margins noted. Extraocular tracking is good without limitation to gaze excursion or nystagmus noted. Normal smooth pursuit is noted. Hearing is grossly intact. Face is symmetric with normal facial animation and normal facial sensation. Speech is clear with no dysarthria noted. There is no hypophonia. There is no lip, neck/head, jaw or voice tremor. Neck is supple with full range of passive and active motion. There are no carotid bruits on auscultation. Oropharynx exam reveals: mild mouth dryness, good dental hygiene and mild to moderate airway crowding, due to redundant soft palate and larger tongue. Tonsils are in place, about 1+ bilaterally. Mallampati is class II. Tongue protrudes centrally and palate elevates symmetrically. Neck size is 17.75 inches. He has a Absent overbite.  Chest: Clear to auscultation without wheezing, rhonchi or crackles noted.  Heart: S1+S2+0, regular and normal without murmurs, rubs or gallops noted.   Abdomen: Soft, non-tender and non-distended with normal bowel sounds appreciated on auscultation.  Extremities: There is no pitting  edema in the distal lower extremities bilaterally. Pedal pulses are intact.  Skin: Warm and dry without trophic changes noted. There are no varicose veins.  Musculoskeletal: exam reveals no obvious joint deformities, tenderness or joint swelling or erythema.   Neurologically:  Mental status: The patient is awake, alert and oriented in all 4 spheres. His immediate and remote memory, attention, language skills and fund of knowledge are appropriate. There is no evidence of aphasia, agnosia, apraxia or anomia. Speech is clear with normal prosody and enunciation. Thought process is linear. Mood is normal and affect is normal.  Cranial nerves II - XII are as described above under HEENT exam. In addition: shoulder shrug is normal with equal shoulder height noted. Motor exam: Normal bulk, strength and tone is noted. There is no drift, tremor or rebound. Romberg is negative. Reflexes are 2+ throughout. Babinski: Toes are flexor bilaterally. Fine motor skills and coordination: intact with normal  finger taps, normal hand movements, normal rapid alternating patting, normal foot taps and normal foot agility.  Cerebellar testing: No dysmetria or intention tremor on finger to nose testing. Heel to shin is unremarkable bilaterally. There is no truncal or gait ataxia.  Sensory exam: intact to light touch, pinprick, vibration, temperature sense in the upper and lower extremities.  Gait, station and balance: He stands easily. No veering to one side is noted. No leaning to one side is noted. Posture is age-appropriate and stance is narrow based. Gait shows normal stride length and normal pace. No problems turning are noted. He turns en bloc. Tandem walk is unremarkable.   Assessment and Plan:  In summary, Jason Mack is a very pleasant 45 y.o.-year old male with an underlying medical history of seasonal allergies, status post cervical spine surgery in 2008, recent pneumonia, smokeless tobacco use, and obesity, whose  with a history and physical exam are indeed concerning for obstructive sleep apnea (OSA). In addition, he has a family history of OSA in his father. I had a long chat with the patient about my findings and the diagnosis of OSA, its prognosis and treatment options. We talked about medical treatments, surgical interventions and non-pharmacological approaches. I explained in particular the risks and ramifications of untreated moderate to severe OSA, especially with respect to developing cardiovascular disease down the Road, including congestive heart failure, difficult to treat hypertension, cardiac arrhythmias, or stroke. Even type 2 diabetes has, in part, been linked to untreated OSA. Symptoms of untreated OSA include daytime sleepiness, memory problems, mood irritability and mood disorder such as depression and anxiety, lack of energy, as well as recurrent headaches, especially morning headaches. We talked about tobacco cessation and trying to maintain a healthy lifestyle in general, as well as the importance of weight control. I encouraged the patient to eat healthy, exercise daily and keep well hydrated, to keep a scheduled bedtime and wake time routine, to not skip any meals and eat healthy snacks in between meals. I advised the patient not to drive when feeling sleepy. I recommended the following at this time: sleep study with potential positive airway pressure titration. (We will score hypopneas at 3% and split the sleep study into diagnostic and treatment portion, if the estimated. 2 hour AHI is >15/h).   I explained the sleep test procedure to the patient and also outlined possible surgical and non-surgical treatment options of OSA, including the use of a custom-made dental device (which would require a referral to a specialist dentist or oral surgeon), upper airway surgical options, such as pillar implants, radiofrequency surgery, tongue base surgery, and UPPP (which would involve a referral to an ENT  surgeon). Rarely, jaw surgery such as mandibular advancement may be considered.  I also explained the CPAP treatment option to the patient, who indicated that he would be willing to try CPAP if the need arises. I explained the importance of being compliant with PAP treatment, not only for insurance purposes but primarily to improve His symptoms, and for the patient's long term health benefit, including to reduce His cardiovascular risks. I answered all his questions today and the patient was in agreement. I would like to see him back after the sleep study is completed and encouraged him to call with any interim questions, concerns, problems or updates.   Thank you very much for allowing me to participate in the care of this nice patient. If I can be of any further assistance to you please do not hesitate  to call me at [phone].  Sincerely,   Star Age, MD, PhD

## 2016-04-14 ENCOUNTER — Ambulatory Visit (INDEPENDENT_AMBULATORY_CARE_PROVIDER_SITE_OTHER): Payer: 59 | Admitting: Neurology

## 2016-04-14 DIAGNOSIS — G479 Sleep disorder, unspecified: Secondary | ICD-10-CM

## 2016-04-14 DIAGNOSIS — G4710 Hypersomnia, unspecified: Secondary | ICD-10-CM | POA: Diagnosis not present

## 2016-04-14 DIAGNOSIS — G4720 Circadian rhythm sleep disorder, unspecified type: Secondary | ICD-10-CM

## 2016-04-14 DIAGNOSIS — G4733 Obstructive sleep apnea (adult) (pediatric): Secondary | ICD-10-CM

## 2016-04-14 DIAGNOSIS — G4761 Periodic limb movement disorder: Secondary | ICD-10-CM

## 2016-04-18 ENCOUNTER — Telehealth: Payer: Self-pay | Admitting: Neurology

## 2016-04-18 DIAGNOSIS — G4720 Circadian rhythm sleep disorder, unspecified type: Secondary | ICD-10-CM

## 2016-04-18 DIAGNOSIS — G4761 Periodic limb movement disorder: Secondary | ICD-10-CM

## 2016-04-18 DIAGNOSIS — G4733 Obstructive sleep apnea (adult) (pediatric): Secondary | ICD-10-CM

## 2016-04-18 NOTE — Telephone Encounter (Addendum)
Patient referred by Dr. Lorelei Pont, seen by me on 04/11/16, diagnostic PSG on 04/14/16, ins: [employer] ins.   Please call and notify the patient that the recent sleep study did confirm the diagnosis of severe obstructive sleep apnea and that I recommend treatment for this in the form of CPAP. This will require a repeat sleep study for proper titration and mask fitting. Please explain to patient and arrange for a CPAP titration study. I have placed an order in the chart. Thanks, and please route to [facility] for scheduling next sleep study.  Star Age, MD, PhD [facility])

## 2016-04-19 NOTE — Telephone Encounter (Signed)
I spoke to patient and he is aware of results and recommendations. He is willing to proceed with titration study. I will send copy to PCP.

## 2016-04-24 ENCOUNTER — Ambulatory Visit (INDEPENDENT_AMBULATORY_CARE_PROVIDER_SITE_OTHER): Payer: 59 | Admitting: Neurology

## 2016-04-24 DIAGNOSIS — G479 Sleep disorder, unspecified: Secondary | ICD-10-CM

## 2016-04-24 DIAGNOSIS — G4733 Obstructive sleep apnea (adult) (pediatric): Secondary | ICD-10-CM | POA: Diagnosis not present

## 2016-04-24 DIAGNOSIS — G4720 Circadian rhythm sleep disorder, unspecified type: Secondary | ICD-10-CM

## 2016-04-28 ENCOUNTER — Telehealth: Payer: Self-pay | Admitting: Neurology

## 2016-04-28 DIAGNOSIS — G4733 Obstructive sleep apnea (adult) (pediatric): Secondary | ICD-10-CM

## 2016-04-28 NOTE — Telephone Encounter (Signed)
Patient referred by Dr. Lorelei Pont, seen by me on 04/11/16, diagnostic PSG on 04/14/16, cpap study on 04/24/16, ins: [employer] ins.  Please call and inform patient that I have entered an order for treatment with positive airway pressure (PAP) treatment of obstructive sleep apnea (OSA). He did well during the latest sleep study with CPAP. We will, therefore, arrange for a machine for home use through a DME (durable medical equipment) company of His choice; and I will see the patient back in follow-up in about 8-10 weeks. Please also explain to the patient that I will be looking out for compliance data, which can be downloaded from the machine (stored on an SD card, that is inserted in the machine) or via remote access through a modem, that is built into the machine. At the time of the followup appointment we will discuss sleep study results and how it is going with PAP treatment at home. Please advise patient to bring His machine at the time of the first FU visit, even though this is cumbersome. Bringing the machine for every visit after that will likely not be needed, but often helps for the first visit to troubleshoot if needed. Please re-enforce the importance of compliance with treatment and the need for [place] to monitor compliance data - often an insurance requirement and actually good feedback for the patient as far as how they are doing.  Also remind patient, that any interim PAP machine or mask issues should be first addressed with the DME company, as they can often help better with technical and mask fit issues. Please ask if patient has a preference regarding DME company.  Please also make sure, the patient has a follow-up appointment with me in about 8-10 weeks from the setup date, thanks.  Once you have spoken to the patient - and faxed/routed report to PCP and referring MD (if other than PCP), you can close this encounter, thanks,   Star Age, MD, PhD [facility])

## 2016-05-01 NOTE — Telephone Encounter (Signed)
Report sent to PCP.  I spoke to patient and he is aware of results and recommendations. He is willing to proceed with treatment. I will send to [facility]. I will send the patient a letter reminding him to make f/u appt and stress the importance of compliance.

## 2016-05-02 ENCOUNTER — Telehealth: Payer: Self-pay

## 2016-05-02 NOTE — Telephone Encounter (Signed)
Pt wants to change [facility] to [facility] call pt [phone]

## 2016-05-02 NOTE — Telephone Encounter (Signed)
Jason Mack/HPMS [phone] called said she did get the pt approved, had him on the schedule. She called him and he said he did not want the machine at all.  FYI

## 2016-05-02 NOTE — Telephone Encounter (Signed)
I called patient back and advised him that I can send referral to [facility]. Sending now.

## 2016-05-02 NOTE — Telephone Encounter (Signed)
See other task. Patient wants to use [facility].

## 2016-05-08 DIAGNOSIS — G4733 Obstructive sleep apnea (adult) (pediatric): Secondary | ICD-10-CM | POA: Diagnosis not present

## 2016-05-12 NOTE — Telephone Encounter (Signed)
Pre Visit [facility]All

## 2016-05-31 ENCOUNTER — Emergency Department
Admission: EM | Admit: 2016-05-31 | Discharge: 2016-05-31 | Disposition: A | Discharge status: Home / Self Care | Payer: 59 | Source: Home / Self Care | Attending: Family Medicine | Admitting: Family Medicine

## 2016-05-31 ENCOUNTER — Observation Stay (HOSPITAL_COMMUNITY)
Admission: EM | Admit: 2016-05-31 | Discharge: 2016-06-01 | Disposition: A | Discharge status: Home / Self Care | Payer: 59 | Attending: General Surgery | Admitting: General Surgery

## 2016-05-31 ENCOUNTER — Emergency Department (HOSPITAL_COMMUNITY): Payer: 59 | Admitting: Registered Nurse

## 2016-05-31 ENCOUNTER — Emergency Department (INDEPENDENT_AMBULATORY_CARE_PROVIDER_SITE_OTHER): Payer: 59

## 2016-05-31 ENCOUNTER — Encounter (HOSPITAL_COMMUNITY)
Admission: EM | Disposition: A | Discharge status: Home / Self Care | Payer: Self-pay | Source: Home / Self Care | Attending: Emergency Medicine

## 2016-05-31 ENCOUNTER — Encounter: Payer: Self-pay | Admitting: Emergency Medicine

## 2016-05-31 ENCOUNTER — Encounter (HOSPITAL_COMMUNITY): Payer: Self-pay | Admitting: *Deleted

## 2016-05-31 DIAGNOSIS — Z79899 Other long term (current) drug therapy: Secondary | ICD-10-CM | POA: Insufficient documentation

## 2016-05-31 DIAGNOSIS — R1013 Epigastric pain: Secondary | ICD-10-CM

## 2016-05-31 DIAGNOSIS — K3580 Unspecified acute appendicitis: Secondary | ICD-10-CM | POA: Diagnosis not present

## 2016-05-31 DIAGNOSIS — D1779 Benign lipomatous neoplasm of other sites: Secondary | ICD-10-CM | POA: Diagnosis not present

## 2016-05-31 DIAGNOSIS — K353 Acute appendicitis with localized peritonitis: Secondary | ICD-10-CM | POA: Diagnosis not present

## 2016-05-31 DIAGNOSIS — R110 Nausea: Secondary | ICD-10-CM | POA: Diagnosis not present

## 2016-05-31 DIAGNOSIS — G4730 Sleep apnea, unspecified: Secondary | ICD-10-CM | POA: Diagnosis not present

## 2016-05-31 DIAGNOSIS — D175 Benign lipomatous neoplasm of intra-abdominal organs: Secondary | ICD-10-CM | POA: Diagnosis not present

## 2016-05-31 HISTORY — DX: Sleep apnea, unspecified: G47.30

## 2016-05-31 HISTORY — PX: LAPAROSCOPIC APPENDECTOMY: SHX408

## 2016-05-31 LAB — POCT CBC W AUTO DIFF (K'VILLE URGENT CARE)

## 2016-05-31 SURGERY — APPENDECTOMY, LAPAROSCOPIC
Anesthesia: Regional

## 2016-05-31 MED ORDER — KCL IN DEXTROSE-NACL 20-5-0.9 MEQ/L-%-% IV SOLN
INTRAVENOUS | Status: DC
  Administered 2016-06-01: 01:00:00 via INTRAVENOUS
  Filled 2016-05-31 (×2): qty 1000

## 2016-05-31 MED ORDER — PROPOFOL 10 MG/ML IV BOLUS
INTRAVENOUS | Status: DC | PRN
  Administered 2016-05-31: 200 mg via INTRAVENOUS

## 2016-05-31 MED ORDER — ONDANSETRON HCL 4 MG/2ML IJ SOLN
INTRAMUSCULAR | Status: AC
  Filled 2016-05-31: qty 2

## 2016-05-31 MED ORDER — FAMOTIDINE IN NACL 20-0.9 MG/50ML-% IV SOLN
20.0000 mg | Freq: Two times a day (BID) | INTRAVENOUS | Status: DC
  Administered 2016-06-01 (×2): 20 mg via INTRAVENOUS
  Filled 2016-05-31 (×3): qty 50

## 2016-05-31 MED ORDER — ALBUTEROL SULFATE (2.5 MG/3ML) 0.083% IN NEBU
2.5000 mg | INHALATION_SOLUTION | Freq: Four times a day (QID) | RESPIRATORY_TRACT | Status: DC | PRN
Start: 2016-05-31 — End: 2016-06-01

## 2016-05-31 MED ORDER — FENTANYL CITRATE (PF) 250 MCG/5ML IJ SOLN
INTRAMUSCULAR | Status: AC
  Filled 2016-05-31: qty 5

## 2016-05-31 MED ORDER — GI COCKTAIL ~~LOC~~
30.0000 mL | Freq: Once | ORAL | Status: AC
  Administered 2016-05-31: 30 mL via ORAL

## 2016-05-31 MED ORDER — PHENYLEPHRINE HCL 10 MG/ML IJ SOLN
INTRAMUSCULAR | Status: DC | PRN
  Administered 2016-05-31: 80 ug via INTRAVENOUS

## 2016-05-31 MED ORDER — MIDAZOLAM HCL 2 MG/2ML IJ SOLN
INTRAMUSCULAR | Status: DC | PRN
  Administered 2016-05-31: 2 mg via INTRAVENOUS

## 2016-05-31 MED ORDER — ONDANSETRON HCL 4 MG/2ML IJ SOLN
4.0000 mg | Freq: Once | INTRAMUSCULAR | Status: AC
  Administered 2016-05-31: 4 mg via INTRAVENOUS
  Filled 2016-05-31: qty 2

## 2016-05-31 MED ORDER — ALBUTEROL SULFATE HFA 108 (90 BASE) MCG/ACT IN AERS: 2.0000 | INHALATION_SPRAY | Freq: Four times a day (QID) | RESPIRATORY_TRACT | Status: DC | PRN

## 2016-05-31 MED ORDER — ONDANSETRON HCL 4 MG/2ML IJ SOLN: 4.0000 mg | Freq: Four times a day (QID) | INTRAMUSCULAR | Status: DC | PRN

## 2016-05-31 MED ORDER — LACTATED RINGERS IR SOLN
Status: DC | PRN
  Administered 2016-05-31: 1000 mL

## 2016-05-31 MED ORDER — ROCURONIUM BROMIDE 100 MG/10ML IV SOLN
INTRAVENOUS | Status: AC
  Filled 2016-05-31: qty 1

## 2016-05-31 MED ORDER — ONDANSETRON 4 MG PO TBDP
4.0000 mg | ORAL_TABLET | Freq: Once | ORAL | Status: AC
  Administered 2016-05-31: 4 mg via ORAL

## 2016-05-31 MED ORDER — LIDOCAINE HCL (CARDIAC) 20 MG/ML IV SOLN
INTRAVENOUS | Status: AC
  Filled 2016-05-31: qty 5

## 2016-05-31 MED ORDER — SUCCINYLCHOLINE CHLORIDE 20 MG/ML IJ SOLN
INTRAMUSCULAR | Status: DC | PRN
  Administered 2016-05-31: 100 mg via INTRAVENOUS

## 2016-05-31 MED ORDER — LIDOCAINE HCL (CARDIAC) 20 MG/ML IV SOLN
INTRAVENOUS | Status: DC | PRN
  Administered 2016-05-31: 80 mg via INTRAVENOUS

## 2016-05-31 MED ORDER — HYDROMORPHONE HCL 1 MG/ML IJ SOLN
1.0000 mg | Freq: Once | INTRAMUSCULAR | Status: AC
  Administered 2016-05-31: 1 mg via INTRAVENOUS
  Filled 2016-05-31: qty 1

## 2016-05-31 MED ORDER — SUGAMMADEX SODIUM 200 MG/2ML IV SOLN
INTRAVENOUS | Status: DC | PRN
  Administered 2016-05-31: 200 mg via INTRAVENOUS

## 2016-05-31 MED ORDER — ONDANSETRON 4 MG PO TBDP: 4.0000 mg | ORAL_TABLET | Freq: Four times a day (QID) | ORAL | Status: DC | PRN

## 2016-05-31 MED ORDER — ONDANSETRON HCL 4 MG/2ML IJ SOLN
INTRAMUSCULAR | Status: DC | PRN
  Administered 2016-05-31: 4 mg via INTRAVENOUS

## 2016-05-31 MED ORDER — BUPIVACAINE-EPINEPHRINE 0.25% -1:200000 IJ SOLN
INTRAMUSCULAR | Status: DC | PRN
  Administered 2016-05-31: 15 mL

## 2016-05-31 MED ORDER — DEXTROSE 5 % IV SOLN
2.0000 g | Freq: Once | INTRAVENOUS | Status: AC
  Administered 2016-05-31: 2 g via INTRAVENOUS
  Filled 2016-05-31: qty 2

## 2016-05-31 MED ORDER — MIDAZOLAM HCL 2 MG/2ML IJ SOLN
INTRAMUSCULAR | Status: AC
  Filled 2016-05-31: qty 2

## 2016-05-31 MED ORDER — HEPARIN SODIUM (PORCINE) 5000 UNIT/ML IJ SOLN
5000.0000 [IU] | Freq: Three times a day (TID) | INTRAMUSCULAR | Status: DC
  Filled 2016-05-31: qty 1

## 2016-05-31 MED ORDER — FAMOTIDINE 40 MG PO TABS
40.0000 mg | ORAL_TABLET | Freq: Once | ORAL | Status: AC
  Administered 2016-05-31: 40 mg via ORAL

## 2016-05-31 MED ORDER — ROCURONIUM BROMIDE 100 MG/10ML IV SOLN
INTRAVENOUS | Status: DC | PRN
  Administered 2016-05-31: 10 mg via INTRAVENOUS
  Administered 2016-05-31: 40 mg via INTRAVENOUS

## 2016-05-31 MED ORDER — PROPOFOL 10 MG/ML IV BOLUS
INTRAVENOUS | Status: AC
  Filled 2016-05-31: qty 20

## 2016-05-31 MED ORDER — LACTATED RINGERS IV SOLN
INTRAVENOUS | Status: DC | PRN
  Administered 2016-05-31 (×3): via INTRAVENOUS

## 2016-05-31 MED ORDER — OXYCODONE-ACETAMINOPHEN 5-325 MG PO TABS
1.0000 | ORAL_TABLET | ORAL | Status: DC | PRN
  Administered 2016-06-01 (×3): 2 via ORAL
  Filled 2016-05-31 (×4): qty 2

## 2016-05-31 MED ORDER — SODIUM CHLORIDE 0.9 % IV SOLN
INTRAVENOUS | Status: DC
  Administered 2016-05-31: 18:00:00 via INTRAVENOUS

## 2016-05-31 MED ORDER — FENTANYL CITRATE (PF) 100 MCG/2ML IJ SOLN
25.0000 ug | INTRAMUSCULAR | Status: DC | PRN
  Administered 2016-05-31: 25 ug via INTRAVENOUS
  Filled 2016-05-31: qty 2

## 2016-05-31 MED ORDER — BUPIVACAINE-EPINEPHRINE 0.25% -1:200000 IJ SOLN
INTRAMUSCULAR | Status: AC
  Filled 2016-05-31: qty 1

## 2016-05-31 MED ORDER — PROMETHAZINE HCL 25 MG/ML IJ SOLN: 6.2500 mg | INTRAMUSCULAR | Status: DC | PRN

## 2016-05-31 MED ORDER — FENTANYL CITRATE (PF) 250 MCG/5ML IJ SOLN
INTRAMUSCULAR | Status: DC | PRN
  Administered 2016-05-31: 150 ug via INTRAVENOUS
  Administered 2016-05-31: 100 ug via INTRAVENOUS

## 2016-05-31 MED ORDER — ACETAMINOPHEN 325 MG PO TABS: 650.0000 mg | ORAL_TABLET | Freq: Four times a day (QID) | ORAL | Status: DC | PRN

## 2016-05-31 MED ORDER — ACETAMINOPHEN 650 MG RE SUPP: 650.0000 mg | Freq: Four times a day (QID) | RECTAL | Status: DC | PRN

## 2016-05-31 MED ORDER — FENTANYL CITRATE (PF) 100 MCG/2ML IJ SOLN: 25.0000 ug | INTRAMUSCULAR | Status: DC | PRN

## 2016-05-31 MED ORDER — IOPAMIDOL (ISOVUE-300) INJECTION 61%: 100.0000 mL | Freq: Once | INTRAVENOUS | Status: AC | PRN

## 2016-05-31 MED ORDER — METRONIDAZOLE IN NACL 5-0.79 MG/ML-% IV SOLN
500.0000 mg | Freq: Once | INTRAVENOUS | Status: AC
  Administered 2016-05-31: 500 mg via INTRAVENOUS
  Filled 2016-05-31: qty 100

## 2016-05-31 SURGICAL SUPPLY — 27 items
COVER SURGICAL LIGHT HANDLE (MISCELLANEOUS) ×2 IMPLANT
CUTTER FLEX LINEAR 45M (STAPLE) ×2 IMPLANT
DECANTER SPIKE VIAL GLASS SM (MISCELLANEOUS) ×2 IMPLANT
DRAPE LAPAROSCOPIC ABDOMINAL (DRAPES) ×2 IMPLANT
DRAPE UTILITY XL STRL (DRAPES) ×2 IMPLANT
ELECT PENCIL ROCKER SW 15FT (MISCELLANEOUS) ×2 IMPLANT
ELECT REM PT RETURN 9FT ADLT (ELECTROSURGICAL) ×2
ELECTRODE REM PT RTRN 9FT ADLT (ELECTROSURGICAL) ×1 IMPLANT
GLOVE BIO SURGEON STRL SZ7.5 (GLOVE) ×2 IMPLANT
GOWN STRL REUS W/ TWL XL LVL3 (GOWN DISPOSABLE) ×1 IMPLANT
GOWN STRL REUS W/TWL XL LVL3 (GOWN DISPOSABLE) ×5 IMPLANT
IV LACTATED RINGERS 1000ML (IV SOLUTION) ×2 IMPLANT
KIT BASIN OR (CUSTOM PROCEDURE TRAY) ×2 IMPLANT
LIQUID BAND (GAUZE/BANDAGES/DRESSINGS) ×2 IMPLANT
NS IRRIG 1000ML POUR BTL (IV SOLUTION) ×2 IMPLANT
RELOAD STAPLE TA45 3.5 REG BLU (ENDOMECHANICALS) ×2 IMPLANT
SET IRRIG TUBING LAPAROSCOPIC (IRRIGATION / IRRIGATOR) ×2 IMPLANT
SHEARS HARMONIC ACE PLUS 36CM (ENDOMECHANICALS) ×2 IMPLANT
SOLUTION ANTI FOG 6CC (MISCELLANEOUS) ×2 IMPLANT
SUT MNCRL AB 4-0 PS2 18 (SUTURE) ×2 IMPLANT
TOWEL OR 17X26 10 PK STRL BLUE (TOWEL DISPOSABLE) ×2 IMPLANT
TRAY FOLEY W/METER SILVER 16FR (SET/KITS/TRAYS/PACK) ×2 IMPLANT
TRAY LAPAROSCOPIC (CUSTOM PROCEDURE TRAY) ×2 IMPLANT
TROCAR BLADELESS OPT 5 100 (ENDOMECHANICALS) ×2 IMPLANT
TROCAR XCEL BLUNT TIP 100MML (ENDOMECHANICALS) ×2 IMPLANT
TROCAR XCEL NON-BLD 5MMX100MML (ENDOMECHANICALS) ×2 IMPLANT
TUBING INSUF HEATED (TUBING) ×2 IMPLANT

## 2016-05-31 NOTE — H&P (Signed)
Jason Mack is an 45 y.o. male.   Chief Complaint: abdominal pain HPI: The patient presents with abdominal pain since 4am. Pain has worsened during the day. Pain is associated with nausea and vomiting. He came to ER and CT shows appendicitis.   Past Medical History  Diagnosis Date  . Seasonal allergies   . Allergy   . Sleep apnea     Past Surgical History  Procedure Laterality Date  . Back surgery    . Cervical fusion  2008    Family History  Problem Relation Age of Onset  . Diabetes Father   . Heart murmur Mother    Social History:  reports that he has never smoked. His smokeless tobacco use includes Chew. He reports that he drinks alcohol. He reports that he does not use illicit drugs.  Allergies:  Allergies  Allergen Reactions  . Mushroom Ext Cmplx(Shiitake-Reishi-Mait) Diarrhea and Nausea And Vomiting  . Hydrocodone Other (See Comments)    Makes him aggressive/mean     (Not in a hospital admission)  Results for orders placed or performed during the hospital encounter of 05/31/16 (from the past 48 hour(s))  CBC w auto diff ([facility])     Status: None   Collection Time: 05/31/16  1:38 PM  Result Value Ref Range   WBC  4.5 - 10.5 K/uL    Comment: see scanned report   Lymphocytes relative %  15 - 45 %   Monocytes relative %  2 - 10 %   Neutrophils relative % (GR)  44 - 76 %   Lymphocytes absolute  0.1 - 1.8 K/uL   Monocyes absolute  0.1 - 1 K/uL   Neutrophils absolute (GR#)  1.7 - 7.7 K/uL   RBC  4.2 - 5.8 MIL/uL   Hemoglobin  13 - 17 g/dL   Hematocrit  38.5 - 51 %   MCV  80 - 98 fL   MCH  26.5 - 32.5 pg   MCHC  32.5 - 36.9 g/dL   RDW  11.6 - 14 %   Platelet count  140 - 400 K/uL   MPV  7.8 - 11 fL   Ct Abdomen Pelvis W Contrast  05/31/2016  CLINICAL DATA:  Epigastric pain and tenderness since 0400 hours, nausea with 2 episodes of vomiting some abdominal burning, initial encounter EXAM: CT ABDOMEN AND PELVIS WITH CONTRAST TECHNIQUE:  Multidetector CT imaging of the abdomen and pelvis was performed using the standard protocol following bolus administration of intravenous contrast. Sagittal and coronal MPR images reconstructed from axial data set. CONTRAST:  Dilute oral contrast.  100 cc Isovue 300 IV. COMPARISON:  None FINDINGS: Lower chest:  Minimal dependent atelectasis at lung bases. Hepatobiliary: Contracted gallbladder. Liver unremarkable. No biliary dilatation. Pancreas: Small lipoma at uncinate 12 x 7 mm image 41. Remainder of pancreas normal appearance. Spleen: Normal appearance Adrenals/Urinary Tract: Adrenal glands normal appearance. Kidneys normal appearance without mass or hydronephrosis. Ureters and bladder normal appearance. Normal appearing prostate gland. Stomach/Bowel: Enlarged appendix up to 10 mm diameter with small appendicolith, mild thickening/enhancement of the appendiceal wall and periappendiceal inflammatory changes compatible with acute appendicitis. No evidence of perforation or abscess. Stomach and bowel loops normal appearance. Vascular/Lymphatic: Scattered phleboliths. Vascular structures patent. No adenopathy. Reproductive: Minimally prominent seminal vesicles without discrete mass. Other: No free air, free fluid or mass. Minimal infiltration of small bowel mesenteries which may represent minimal fibrosing mesenteritis. No hernia. Musculoskeletal: Osseous structures unremarkable. IMPRESSION: Acute appendicitis. 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 Findings called to Noland Fordyce PA on 05/31/2014 at 1532 hr. Electronically Signed   By: Lavonia Dana M.D.   On: 05/31/2016 15:33    Review of Systems  Constitutional: Negative.   HENT: Negative.   Eyes: Negative.   Respiratory: Negative.   Cardiovascular: Negative.   Gastrointestinal: Positive for nausea, vomiting and abdominal pain.  Genitourinary: Negative.   Musculoskeletal: Negative.   Skin: Negative.   Neurological: Negative.    Endo/Heme/Allergies: Negative.   Psychiatric/Behavioral: Negative.     Blood pressure 145/87, pulse 53, temperature 99.1 F (37.3 C), temperature source Oral, resp. rate 18, SpO2 96 %. Physical Exam  Constitutional: He is oriented to person, place, and time. He appears well-developed and well-nourished.  HENT:  Head: Normocephalic and atraumatic.  Eyes: Conjunctivae and EOM are normal. Pupils are equal, round, and reactive to light.  Neck: Normal range of motion. Neck supple.  Cardiovascular: Normal rate, regular rhythm and normal heart sounds.   Respiratory: Effort normal and breath sounds normal.  GI: Soft. Bowel sounds are normal.  There is moderate tenderness in the RLQ but no peritonitis  Musculoskeletal: Normal range of motion.  Neurological: He is alert and oriented to person, place, and time.  Skin: Skin is warm and dry.  Psychiatric: He has a normal mood and affect. His behavior is normal.     Assessment/Plan The patient appears to have acute appendicitis. Because of the risk of perforation and sepsis I think he would benefit from having his appendix removed. I have discussed with him the risks and benefits of the surgery as well as some of the technical aspects and he understands and wishes to proceed.  Merrie Roof, MD 05/31/2016, 7:38 PM

## 2016-05-31 NOTE — ED Notes (Signed)
Pt transported to surgery

## 2016-05-31 NOTE — ED Notes (Signed)
Epigastric pain since this morning, burning

## 2016-05-31 NOTE — Transfer of Care (Signed)
Immediate Anesthesia Transfer of Care Note  Patient: Jason Mack  Procedure(s) Performed: Procedure(s): APPENDECTOMY LAPAROSCOPIC (N/A)  Patient Location: PACU  Anesthesia Type:General  Level of Consciousness: sedated  Airway & Oxygen Therapy: Patient Spontanous Breathing and Patient connected to face mask oxygen  Post-op Assessment: Report given to RN and Post -op Vital signs reviewed and stable  Post vital signs: Reviewed and stable  Last Vitals:  Filed Vitals:   05/31/16 1754 05/31/16 1842  BP: 145/105 145/87  Pulse: 55 53  Temp: 37.3 C   Resp: 22 18    Last Pain:  Filed Vitals:   05/31/16 1843  PainSc: 10-Worst pain ever         Complications: No apparent anesthesia complications

## 2016-05-31 NOTE — Op Note (Signed)
05/31/2016  9:21 PM  PATIENT:  Jason Mack  45 y.o. male  PRE-OPERATIVE DIAGNOSIS:  acute appendicitis  POST-OPERATIVE DIAGNOSIS:  acute appendicitis  PROCEDURE:  Procedure(s): APPENDECTOMY LAPAROSCOPIC (N/A)  SURGEON:  Surgeon(s) and Role:    * Jovita Kussmaul, MD - Primary  PHYSICIAN ASSISTANT:   ASSISTANTS: none   ANESTHESIA:   general  EBL:  Total I/O In: -  Out: 125 [Urine:100; Blood:25]  BLOOD ADMINISTERED:none  DRAINS: none   LOCAL MEDICATIONS USED:  MARCAINE     SPECIMEN:  Source of Specimen:  appendix  DISPOSITION OF SPECIMEN:  PATHOLOGY  COUNTS:  YES  TOURNIQUET:  * No tourniquets in log *  DICTATION: .Dragon Dictation   After informed consent was obtained patient was brought to the operating room placed in the supine position on the operating room table. After adequate induction of general anesthesia the patient's abdomen was prepped with ChloraPrep, allowed to dry, and draped in usual sterile manner. An appropriate timeout was performed. The area below the umbilicus was infiltrated with quarter percent Marcaine. A small incision was made with a 15 blade knife. This incision was carried down through the subcutaneous tissue bluntly with a hemostat and [employer] retractors until the linea alba was identified. The linea alba was incised with a 15 blade knife. Each side was grasped Coker clamps and elevated anteriorly. The preperitoneal space was probed bluntly with a hemostat until the peritoneum was opened and access was gained to the abdominal cavity. A 0 Vicryl purse string stitch was placed in the fascia surrounding the opening. A Hassan cannula was placed through the opening and anchored in place with the previously placed Vicryl purse string stitch. The laparoscope was placed through the [facility] cannula. The abdomen was insufflated with carbon dioxide without difficulty. The patient was placed in trendelenburg position and rotated slightly with the right side  up. Next the suprapubic area was infiltrated with quarter percent Marcaine. A small incision was made with a 15 blade knife. A 5 mm port was placed bluntly through this incision into the abdominal cavity. A site was then chosen between the 2 port for placement of a 5 mm port. The area was infiltrated with quarter percent Marcaine. A small stab incision was made with a 15 blade knife. A 5 mm port was placed bluntly through this incision and the abdominal cavity under direct vision. The laparoscope was then moved to the suprapubic port. Using a Glassman grasper and harmonic scalpel the right lower quadrant was inspected. The appendix was readily identified. At this point in the case the patient shifted on the bed despite having a belt across his upper thighs. I caught his torso and anesthesia secured his head. He was repositioned and secured and the case was continued. The patient never came off the bed. The appendix was elevated anteriorly and the mesoappendix was taken down sharply with the harmonic scalpel. Once the base of the appendix where it joined the cecum was identified and cleared of any tissue then a laparoscopic GIA blue load 6 row stapler was placed through the [facility] cannula. The stapler was placed across the base of the appendix clamped and fired thereby dividing the base of the appendix between staple lines. A laparoscopic bag was then inserted through the [facility] cannula. The appendix was placed within the bag and the bag was sealed. The abdomen was then irrigated with copious amounts of saline until the effluent was clear. No other abnormalities were noted. The appendix and bag  were removed with the [facility] cannula through the infraumbilical port without difficulty. The fascial defect was closed with the previously placed Vicryl pursestring stitch as well as with another interrupted 0 Vicryl figure-of-eight stitch. The rest of the ports were removed under direct vision and were found to be hemostatic.  The gas was allowed to escape. The skin incisions were closed with interrupted 4-0 Monocryl subcuticular stitches. Dermabond dressings were applied. The patient tolerated the procedure well. At the end of the case all needle sponge and instrument counts were correct. The patient was then awakened and taken to recovery in stable condition.  PLAN OF CARE: Admit for overnight observation  PATIENT DISPOSITION:  PACU - hemodynamically stable.   Delay start of Pharmacological VTE agent (>24hrs) due to surgical blood loss or risk of bleeding: no

## 2016-05-31 NOTE — ED Notes (Signed)
Pt left via private vehicle with his father driving him to [facility] ED. Copy of CBC sent with pt. O'Malley,PA spoke to charge nurse at [place] report given. Charna Archer, LPN

## 2016-05-31 NOTE — ED Provider Notes (Signed)
CSN: [id number]     Arrival date & time 05/31/16  1301 History   First MD Initiated Contact with Patient 05/31/16 1312     Chief Complaint  Patient presents with  . Abdominal Pain   (Consider location/radiation/quality/duration/timing/severity/associated sxs/prior Treatment) HPI Jason Mack is a 45 y.o. male presenting to UC with c/o sudden onset burning epigastric pain that started this morning. Pain does not radiate into chest or back, 3/10 at this time. Mild associated nausea but no vomiting. Pt notes when he woke this morning he felt he had to use the bathroom but when he tried he could not go. Denies feeling constipated though. No hx of acid reflux. He notes he does have increase stress at work but not significant.  Denies fever, chills, vomiting or diarrhea. He has not tried anything for symptoms PTA. No hx of abdominal surgeries. No hx of pancreatitis. No urinary symptoms. Denies chest pain or SOB.  No hx of DM. He does drink a few beers a week but does not consider it a significant amount.  No hx of high cholesterol.     Past Medical History  Diagnosis Date  . Seasonal allergies   . Allergy    Past Surgical History  Procedure Laterality Date  . Back surgery    . Cervical fusion  2008   Family History  Problem Relation Age of Onset  . Diabetes Father   . Heart murmur Mother    Social History  Substance Use Topics  . Smoking status: Never Smoker   . Smokeless tobacco: Current User    Types: Chew  . Alcohol Use: 0.0 oz/week    0 Standard drinks or equivalent per week    Review of Systems  Constitutional: Negative for fever and chills.  Respiratory: Negative for cough, chest tightness and shortness of breath.   Cardiovascular: Negative for chest pain and palpitations.  Gastrointestinal: Positive for nausea and abdominal pain. Negative for vomiting, diarrhea, constipation and blood in stool.  Genitourinary: Negative for dysuria, frequency, hematuria and flank pain.   Musculoskeletal: Negative for myalgias and back pain.    Allergies  Mushroom ext cmplx(shiitake-reishi-mait)  Home Medications   Prior to Admission medications   Medication Sig Start Date End Date Taking? Authorizing Provider  albuterol (PROVENTIL HFA;VENTOLIN HFA) 108 (90 Base) MCG/ACT inhaler Inhale 2 puffs into the lungs every 6 (six) hours as needed for wheezing or shortness of breath. 02/28/16   Darreld Mclean, MD   Meds Ordered and Administered this Visit   Medications  famotidine (PEPCID) tablet 40 mg (40 mg Oral Given 05/31/16 1340)  ondansetron (ZOFRAN-ODT) disintegrating tablet 4 mg (4 mg Oral Given 05/31/16 1340)  gi cocktail (Maalox,Lidocaine,Donnatal) (30 mLs Oral Given 05/31/16 1400)    BP 163/90 mmHg  Pulse 50  Temp(Src) 98.1 F (36.7 C) (Oral)  Ht 6\' 2"  (1.88 m)  Wt 238 lb (107.956 kg)  BMI 30.54 kg/m2  SpO2 98% No data found.   Physical Exam  Constitutional: He appears well-developed and well-nourished.  Pt sitting on exam bed, appears mildly uncomfortable.   HENT:  Head: Normocephalic and atraumatic.  Mouth/Throat: Oropharynx is clear and moist.  Eyes: Conjunctivae are normal. No scleral icterus.  Neck: Normal range of motion.  Cardiovascular: Regular rhythm and normal heart sounds.  Bradycardia present.   Mild bradycardia. Regular rhythm.   Pulmonary/Chest: Effort normal and breath sounds normal. No respiratory distress. He has no wheezes. He has no rales. He exhibits no tenderness.  Abdominal: Soft.  Bowel sounds are normal. He exhibits no distension and no mass. There is tenderness. There is no rebound, no guarding and no CVA tenderness.  Soft, non-distended. Tenderness to epigastrium. No tenderness to RUQ. Mild guarding. No rebound or masses.   Musculoskeletal: Normal range of motion.  Neurological: He is alert.  Skin: Skin is warm and dry.  Nursing note and vitals reviewed.   ED Course  Procedures (including critical care time)  Labs  Review Labs Reviewed  COMPLETE METABOLIC PANEL WITH GFR  LIPASE  POCT CBC W AUTO DIFF ([facility])    Imaging Review Ct Abdomen Pelvis W Contrast  05/31/2016  CLINICAL DATA:  Epigastric pain and tenderness since 0400 hours, nausea with 2 episodes of vomiting some abdominal burning, initial encounter EXAM: CT ABDOMEN AND PELVIS WITH CONTRAST TECHNIQUE: Multidetector CT imaging of the abdomen and pelvis was performed using the standard protocol following bolus administration of intravenous contrast. Sagittal and coronal MPR images reconstructed from axial data set. CONTRAST:  Dilute oral contrast.  100 cc Isovue 300 IV. COMPARISON:  None FINDINGS: Lower chest:  Minimal dependent atelectasis at lung bases. Hepatobiliary: Contracted gallbladder. Liver unremarkable. No biliary dilatation. Pancreas: Small lipoma at uncinate 12 x 7 mm image 41. Remainder of pancreas normal appearance. Spleen: Normal appearance Adrenals/Urinary Tract: Adrenal glands normal appearance. Kidneys normal appearance without mass or hydronephrosis. Ureters and bladder normal appearance. Normal appearing prostate gland. Stomach/Bowel: Enlarged appendix up to 10 mm diameter with small appendicolith, mild thickening/enhancement of the appendiceal wall and periappendiceal inflammatory changes compatible with acute appendicitis. No evidence of perforation or abscess. Stomach and bowel loops normal appearance. Vascular/Lymphatic: Scattered phleboliths. Vascular structures patent. No adenopathy. Reproductive: Minimally prominent seminal vesicles without discrete mass. Other: No free air, free fluid or mass. Minimal infiltration of small bowel mesenteries which may represent minimal fibrosing mesenteritis. No hernia. Musculoskeletal: Osseous structures unremarkable. IMPRESSION: Acute appendicitis. 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 Findings called to Noland Fordyce PA on 05/31/2014 at 1532 hr. Electronically Signed   By: Lavonia Dana M.D.   On: 05/31/2016 15:33      MDM   1. Acute appendicitis, unspecified acute appendicitis type   2. Nausea   3. Epigastric pain     Pt c/o epigastric pain with associated nausea.  Tenderness to epigastrium on exam.  Vitals- HR- 50, otherwise WNL. Pt is afebrile.  Symptoms likely due to acid reflux given location.  Pepcid 40mg  PO and Zofran 4mg  OTD given.   Labs: CBC- mild leukocytosis, WBC- 12.7 CMP and Lipase pending  Pt reassessed. No improvement in epigastric pain.  Discussed CT vs giving GI cocktail to see if pain improves. If no improvement will get CT.    2:17 PM Pt lying on exam bed in fetal position, still appears uncomfortable. No relief with medications given. Will get CT scan to r/o pancreatitis, cholecystitis or other emergent process.   Was notified by radiology, pt has signs of early acute appendicitis.  Pt declined EMS transport. Requesting to go to [facility], will have his father take him via POV.  Discussed risk of delayed care, lack of en route monitoring, and risk of rupture.  Pt insists on going POV. Vitals stable.  Pt NPO at this time.  Charge Nurse Lanetta Inch at [facility] of pt's pending arrival.    Noland Fordyce, [place] 05/31/16 1641

## 2016-05-31 NOTE — Anesthesia Procedure Notes (Signed)
Procedure Name: Intubation Date/Time: 05/31/2016 8:10 PM Performed by: Cynda Familia Pre-anesthesia Checklist: Patient identified, Emergency Drugs available, Suction available and Patient being monitored Patient Re-evaluated:Patient Re-evaluated prior to inductionOxygen Delivery Method: Circle System Utilized Preoxygenation: Pre-oxygenation with 100% oxygen Intubation Type: IV induction, Cricoid Pressure applied and Rapid sequence Ventilation: Mask ventilation without difficulty Laryngoscope Size: Miller and 2 Grade View: Grade I Tube type: Oral Number of attempts: 1 Airway Equipment and Method: Stylet Placement Confirmation: ETT inserted through vocal cords under direct vision,  positive ETCO2 and breath sounds checked- equal and bilateral Secured at: 22 cm Tube secured with: Tape Dental Injury: Teeth and Oropharynx as per pre-operative assessment  Comments: Smooth RSI and cricoid pressure by Edwards--   Intubation AM CRNA---  Atraumatic-- teeth and mouth as preop---front teeth with irregular surfaces prior to laryngoscopy--- bilat BS Oletta Lamas

## 2016-05-31 NOTE — Anesthesia Postprocedure Evaluation (Signed)
Anesthesia Post Note  Patient: Jason Mack  Procedure(s) Performed: Procedure(s) (LRB): APPENDECTOMY LAPAROSCOPIC (N/A)  Patient location during evaluation: PACU Anesthesia Type: General Level of consciousness: awake Pain management: pain level controlled Vital Signs Assessment: post-procedure vital signs reviewed and stable Respiratory status: spontaneous breathing Cardiovascular status: stable Anesthetic complications: no    Last Vitals:  Filed Vitals:   05/31/16 2134 05/31/16 2215  BP: 160/93   Pulse: 88   Temp: 37.1 C 37.2 C  Resp: 17     Last Pain:  Filed Vitals:   05/31/16 2215  PainSc: 0-No pain                 EDWARDS,Lyrica Mcclarty

## 2016-05-31 NOTE — ED Notes (Signed)
Epigastric pain since 4 am.  Unresponsive to NSAIDS and antacids. Patient went to UC in [place], CT Scan done there.  Patient dx with appendicitis.

## 2016-05-31 NOTE — Anesthesia Preprocedure Evaluation (Addendum)
Anesthesia Evaluation  Patient identified by MRN, date of birth, ID bandGeneral Assessment Comment:Risks of aspiration discussed with patient . Questions answered. Neck surgery noted. Patient with no limitations. Reports neck stable and no concerns. CE  Reviewed: Allergy & Precautions, NPO status , Patient's Chart, lab work & pertinent test results  Airway Mallampati: II  TM Distance: >3 FB Neck ROM: Full    Dental  (+) Dental Advisory Given   Pulmonary sleep apnea ,    breath sounds clear to auscultation       Cardiovascular negative cardio ROS   Rhythm:Regular Rate:Normal     Neuro/Psych    GI/Hepatic Neg liver ROS, GI history noted. CE   Endo/Other  negative endocrine ROS  Renal/GU negative Renal ROS     Musculoskeletal   Abdominal   Peds  Hematology   Anesthesia Other Findings   Reproductive/Obstetrics                           Anesthesia Physical Anesthesia Plan  ASA: II and emergent  Anesthesia Plan: Regional   Post-op Pain Management:    Induction: Intravenous, Rapid sequence and Cricoid pressure planned  Airway Management Planned: Oral ETT  Additional Equipment:   Intra-op Plan:   Post-operative Plan: Extubation in OR  Informed Consent: I have reviewed the patients History and Physical, chart, labs and discussed the procedure including the risks, benefits and alternatives for the proposed anesthesia with the patient or authorized representative who has indicated his/her understanding and acceptance.   Dental advisory given  Plan Discussed with: CRNA, Anesthesiologist and Surgeon  Anesthesia Plan Comments:        Anesthesia Quick Evaluation

## 2016-05-31 NOTE — ED Provider Notes (Signed)
CSN: [id number]     Arrival date & time 05/31/16  1644 History   First MD Initiated Contact with Patient 05/31/16 1757     Chief Complaint  Patient presents with  . Abdominal Pain     (Consider location/radiation/quality/duration/timing/severity/associated sxs/prior Treatment) HPI   45 year old male with appendicitis. Diagnosed on a CT scan shortly before arrival. Symptom onset was early this morning. Describes burning pain in his epigastrium which has persisted throughout the day. Associated anorexia. No nausea or vomiting. No urinary complaints. No change in bowel movements. No fevers or chills. At urgent care he was given a GI cocktail which did not improve his symptoms. He ended up having a CT which is significant for acute appendicitis without perforation or abscess and was referred to the ER. Unfortunately he waited in the waiting room for over an hour before he was given room in the ED. Past history significant for sleep apnea and recently started using CPAP which has helped tremendously. No prior abdominal surgeries. Last ate or drank anything around 11:30 AM when he had a small amount of Coke.   Past Medical History  Diagnosis Date  . Seasonal allergies   . Allergy   . Sleep apnea    Past Surgical History  Procedure Laterality Date  . Back surgery    . Cervical fusion  2008   Family History  Problem Relation Age of Onset  . Diabetes Father   . Heart murmur Mother    Social History  Substance Use Topics  . Smoking status: Never Smoker   . Smokeless tobacco: Current User    Types: Chew  . Alcohol Use: 0.0 oz/week    0 Standard drinks or equivalent per week    Review of Systems  All systems reviewed and negative, other than as noted in HPI.   Allergies  Mushroom ext cmplx(shiitake-reishi-mait) and Hydrocodone  Home Medications   Prior to Admission medications   Medication Sig Start Date End Date Taking? Authorizing Provider  albuterol (PROVENTIL HFA;VENTOLIN  HFA) 108 (90 Base) MCG/ACT inhaler Inhale 2 puffs into the lungs every 6 (six) hours as needed for wheezing or shortness of breath. 02/28/16   Gay Filler Copland, MD   BP 145/105 mmHg  Pulse 55  Temp(Src) 99.1 F (37.3 C) (Oral)  Resp 22  SpO2 100% Physical Exam  Constitutional: He appears well-developed and well-nourished. No distress.  HENT:  Head: Normocephalic and atraumatic.  Eyes: Conjunctivae are normal. Right eye exhibits no discharge. Left eye exhibits no discharge.  Neck: Neck supple.  Cardiovascular: Normal rate, regular rhythm and normal heart sounds.  Exam reveals no gallop and no friction rub.   No murmur heard. Pulmonary/Chest: Effort normal and breath sounds normal. No respiratory distress.  Abdominal: Soft. He exhibits no distension. There is no tenderness.  Tenderness in epigastrium, periumbilically and is focally more tender in the right lower quadrant. Voluntary guarding with deep palpation. No rebound. No distention.  Musculoskeletal: He exhibits no edema or tenderness.  Neurological: He is alert.  Skin: Skin is warm and dry.  Psychiatric: He has a normal mood and affect. His behavior is normal. Thought content normal.  Nursing note and vitals reviewed.   ED Course  Procedures (including critical care time) Labs Review Labs Reviewed - No data to display  Imaging Review Ct Abdomen Pelvis W Contrast  05/31/2016  CLINICAL DATA:  Epigastric pain and tenderness since 0400 hours, nausea with 2 episodes of vomiting some abdominal burning, initial encounter EXAM: CT ABDOMEN  AND PELVIS WITH CONTRAST TECHNIQUE: Multidetector CT imaging of the abdomen and pelvis was performed using the standard protocol following bolus administration of intravenous contrast. Sagittal and coronal MPR images reconstructed from axial data set. CONTRAST:  Dilute oral contrast.  100 cc Isovue 300 IV. COMPARISON:  None FINDINGS: Lower chest:  Minimal dependent atelectasis at lung bases.  Hepatobiliary: Contracted gallbladder. Liver unremarkable. No biliary dilatation. Pancreas: Small lipoma at uncinate 12 x 7 mm image 41. Remainder of pancreas normal appearance. Spleen: Normal appearance Adrenals/Urinary Tract: Adrenal glands normal appearance. Kidneys normal appearance without mass or hydronephrosis. Ureters and bladder normal appearance. Normal appearing prostate gland. Stomach/Bowel: Enlarged appendix up to 10 mm diameter with small appendicolith, mild thickening/enhancement of the appendiceal wall and periappendiceal inflammatory changes compatible with acute appendicitis. No evidence of perforation or abscess. Stomach and bowel loops normal appearance. Vascular/Lymphatic: Scattered phleboliths. Vascular structures patent. No adenopathy. Reproductive: Minimally prominent seminal vesicles without discrete mass. Other: No free air, free fluid or mass. Minimal infiltration of small bowel mesenteries which may represent minimal fibrosing mesenteritis. No hernia. Musculoskeletal: Osseous structures unremarkable. IMPRESSION: Acute appendicitis. 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 Findings called to Noland Fordyce PA on 05/31/2014 at 1532 hr. Electronically Signed   By: Lavonia Dana M.D.   On: 05/31/2016 15:33   I have personally reviewed and evaluated these images and lab results as part of my medical decision-making.   EKG Interpretation None      MDM   Final diagnoses:  Acute appendicitis, unspecified acute appendicitis type   45 year old male with acute appendicitis.. IV was placed. Will treat symptoms. Nothing by mouth. Antibiotics. Surgical consultation.    Virgel Manifold, MD 05/31/16 [phone]

## 2016-05-31 NOTE — Discharge Instructions (Signed)
Appendicitis Appendicitis is when the appendix is swollen (inflamed). The inflammation can lead to developing a hole (perforation) and a collection of pus (abscess). CAUSES  There is not always an obvious cause of appendicitis. Sometimes it is caused by an obstruction in the appendix. The obstruction can be caused by:  A small, hard, pea-sized ball of stool (fecalith).  Enlarged lymph glands in the appendix. SYMPTOMS   Pain around your belly button (navel) that moves toward your lower right belly (abdomen). The pain can become more severe and sharp as time passes.  Tenderness in the lower right abdomen. Pain gets worse if you cough or make a sudden movement.  Feeling sick to your stomach (nauseous).  Throwing up (vomiting).  Loss of appetite.  Fever.  Constipation.  Diarrhea.  Generally not feeling well. DIAGNOSIS   Physical exam.  Blood tests.  Urine test.  X-rays or a CT scan may confirm the diagnosis. TREATMENT  Once the diagnosis of appendicitis is made, the most common treatment is to remove the appendix as soon as possible. This procedure is called appendectomy. In an open appendectomy, a cut (incision) is made in the lower right abdomen and the appendix is removed. In a laparoscopic appendectomy, usually 3 small incisions are made. Long, thin instruments and a camera tube are used to remove the appendix. Most patients go home in 24 to 48 hours after appendectomy. In some situations, the appendix may have already perforated and an abscess may have formed. The abscess may have a "wall" around it as seen on a CT scan. In this case, a drain may be placed into the abscess to remove fluid, and you may be treated with antibiotic medicines that kill germs. The medicine is given through a tube in your vein (IV). Once the abscess has resolved, it may or may not be necessary to have an appendectomy. You may need to stay in the hospital longer than 48 hours.   This information is  not intended to replace advice given to you by your health care provider. Make sure you discuss any questions you have with your health care provider.   Document Released: 11/27/2005 Document Revised: 05/28/2012 Document Reviewed: 04/14/2015 Elsevier Interactive Patient Education 2016 [employer] Inc.  

## 2016-05-31 NOTE — ED Notes (Signed)
Surgeon at bedside.  

## 2016-06-01 ENCOUNTER — Telehealth: Payer: Self-pay | Admitting: Emergency Medicine

## 2016-06-01 ENCOUNTER — Encounter (HOSPITAL_COMMUNITY): Payer: Self-pay | Admitting: General Surgery

## 2016-06-01 DIAGNOSIS — G4730 Sleep apnea, unspecified: Secondary | ICD-10-CM | POA: Diagnosis not present

## 2016-06-01 DIAGNOSIS — Z79899 Other long term (current) drug therapy: Secondary | ICD-10-CM | POA: Diagnosis not present

## 2016-06-01 DIAGNOSIS — K353 Acute appendicitis with localized peritonitis: Secondary | ICD-10-CM | POA: Diagnosis not present

## 2016-06-01 MED ORDER — OXYCODONE-ACETAMINOPHEN 5-325 MG PO TABS: 1.0000 | ORAL_TABLET | ORAL | Status: DC | PRN

## 2016-06-01 MED FILL — OXYCODONE/APAP 5/325MG: 5-325 | 3 days supply | Qty: 40 | Fill #0

## 2016-06-01 NOTE — ED Notes (Signed)
Left msg, called to see how he is doing after surgery hope all went well, give [place] a call when he fells like to it to know how hes doing

## 2016-06-01 NOTE — Progress Notes (Signed)
Received call back from, [place], [place] with assistance from [place], [place], per phone conversation, instructed to allow patient to go home with incentive spirometer and to encourage ambulation at home.

## 2016-06-01 NOTE — Progress Notes (Signed)
Patient alert and oriented with pain controlled. Patient's spouse at bedside. Patient and family given discharge instructions and prescriptions. All questions and concerns answered. Patient and spouse verbalized understanding of discharge instructions.  After discharge instructions given, patient began to feel chills. Temperature was obtained at 100.4 orally. Patient received oxycodone-acetaminophen 5-325, 2 tabs, given incentive spirometer and instructed on use, as well as encouraged to maintain hydration. Will continue to monitor. PA paged to inform of patient condition.

## 2016-06-01 NOTE — Discharge Instructions (Signed)

## 2016-06-01 NOTE — Discharge Summary (Signed)
Physician Discharge Summary  Patient ID: Jason Mack MRN: [id number] DOB/AGE: Sep 22, 1971 45 y.o.  Admit date: 05/31/2016 Discharge date: 06/01/2016  Admitting Diagnosis: Acute appendicitis  Discharge Diagnosis Patient Active Problem List   Diagnosis Date Noted  . Acute appendicitis 05/31/2016  . KNEE PAIN, RIGHT 09/18/2010  . CONTUSION, LOWER LEG, LEFT 06/09/2010    Consultants none  Imaging: Ct Abdomen Pelvis W Contrast  05/31/2016  CLINICAL DATA:  Epigastric pain and tenderness since 0400 hours, nausea with 2 episodes of vomiting some abdominal burning, initial encounter EXAM: CT ABDOMEN AND PELVIS WITH CONTRAST TECHNIQUE: Multidetector CT imaging of the abdomen and pelvis was performed using the standard protocol following bolus administration of intravenous contrast. Sagittal and coronal MPR images reconstructed from axial data set. CONTRAST:  Dilute oral contrast.  100 cc Isovue 300 IV. COMPARISON:  None FINDINGS: Lower chest:  Minimal dependent atelectasis at lung bases. Hepatobiliary: Contracted gallbladder. Liver unremarkable. No biliary dilatation. Pancreas: Small lipoma at uncinate 12 x 7 mm image 41. Remainder of pancreas normal appearance. Spleen: Normal appearance Adrenals/Urinary Tract: Adrenal glands normal appearance. Kidneys normal appearance without mass or hydronephrosis. Ureters and bladder normal appearance. Normal appearing prostate gland. Stomach/Bowel: Enlarged appendix up to 10 mm diameter with small appendicolith, mild thickening/enhancement of the appendiceal wall and periappendiceal inflammatory changes compatible with acute appendicitis. No evidence of perforation or abscess. Stomach and bowel loops normal appearance. Vascular/Lymphatic: Scattered phleboliths. Vascular structures patent. No adenopathy. Reproductive: Minimally prominent seminal vesicles without discrete mass. Other: No free air, free fluid or mass. Minimal infiltration of small bowel mesenteries  which may represent minimal fibrosing mesenteritis. No hernia. Musculoskeletal: Osseous structures unremarkable. IMPRESSION: Acute appendicitis. 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 Findings called to Noland Fordyce PA on 05/31/2014 at 1532 hr. Electronically Signed   By: Lavonia Dana M.D.   On: 05/31/2016 15:33    Procedures Laparoscopic appendectomy---Dr. Marlou Starks 05/31/16  Hospital Course:  Jason Mack is a healthy male who presented to [facility] with abdominal pain.  Workup showed acute appendicitis.  Patient was admitted and underwent procedure listed above.  Tolerated procedure well and was transferred to the floor.  Diet was advanced as tolerated.  On POD#1, the patient was voiding well, tolerating diet, ambulating well, pain well controlled, vital signs stable, incisions c/d/i and felt stable for discharge home.  Medication risks, benefits and therapeutic alternatives were reviewed with the patient.  He verbalizes understanding.  Patient will follow up in our office in 2 weeks and knows to call with questions or concerns.  Physical Exam: General:  Alert, NAD, pleasant, comfortable Abd:  Soft, ND, mild tenderness, incisions C/D/I    Medication List    TAKE these medications        albuterol 108 (90 Base) MCG/ACT inhaler  Commonly known as:  PROVENTIL HFA;VENTOLIN HFA  Inhale 2 puffs into the lungs every 6 (six) hours as needed for wheezing or shortness of breath.     multivitamin with minerals tablet  Take 1 tablet by mouth daily.     oxyCODONE-acetaminophen 5-325 MG tablet  Commonly known as:  PERCOCET/ROXICET  Take 1-2 tablets by mouth every 4 (four) hours as needed for moderate pain.             Follow-up Information    Follow up with [facility] On 06/21/2016.   Specialty:  General Surgery   Why:  arrive by 8:15AM for a 8:45AM post op check   Contact information:   [address] [place] [address] [phone]       Signed: Erby Pian,  [facility] [phone]  06/01/2016, 8:44 AM

## 2016-06-08 DIAGNOSIS — G4733 Obstructive sleep apnea (adult) (pediatric): Secondary | ICD-10-CM | POA: Diagnosis not present

## 2016-07-08 DIAGNOSIS — G4733 Obstructive sleep apnea (adult) (pediatric): Secondary | ICD-10-CM | POA: Diagnosis not present

## 2016-07-19 ENCOUNTER — Ambulatory Visit: Payer: 59 | Admitting: Neurology

## 2016-07-19 ENCOUNTER — Encounter: Payer: Self-pay | Admitting: Neurology

## 2016-07-19 VITALS — BP 132/80 | HR 68 | Resp 18 | Ht 62.5 in | Wt 238.0 lb

## 2016-07-19 DIAGNOSIS — Z9989 Dependence on other enabling machines and devices: Principal | ICD-10-CM

## 2016-07-19 DIAGNOSIS — E663 Overweight: Secondary | ICD-10-CM | POA: Diagnosis not present

## 2016-07-19 DIAGNOSIS — G4733 Obstructive sleep apnea (adult) (pediatric): Secondary | ICD-10-CM

## 2016-07-19 DIAGNOSIS — G4761 Periodic limb movement disorder: Secondary | ICD-10-CM

## 2016-07-19 NOTE — Progress Notes (Signed)
Subjective:    Patient ID: Jason Mack is a 45 y.o. male.  HPI     Interim history:   Jason Mack is a 45 year old right-handed gentleman with an underlying medical history of seasonal allergies, status post cervical spine surgery in 2008 (due to Fx cervical vertebrae), recent pneumonia, smokeless tobacco use, and obesity, who presents for follow-up consultation of his obstructive sleep apnea, after his recent sleep studies. The patient is unaccompanied today. I first met him on 04/11/2016 at the request of his primary care physician, at which time he reported snoring and excessive daytime somnolence as well as witnessed apneas per wife. I invited him back for sleep study. He had a baseline sleep study, followed by a CPAP titration study. I talked to him about his test results in detail today. Baseline sleep study from 04/14/2016 showed a sleep efficiency of 62.3%, latency to sleep was 10.5 minutes but wake after sleep onset was 166.5 minutes with moderate sleep fragmentation noted. He had an elevated arousal index. He had an increased percentage of light stage sleep, absence of slow-wave sleep, and a decreased percentage of REM sleep at 7.4% with a prolonged REM latency. He had mild PLMS at 10.7 per hour, resulting in 3.7 arousals per hour. He had no significant EEG or EKG changes. He had mild to moderate and at times loud snoring. Total AHI was elevated at 33 per hour, rising to 53 per hour during REM sleep and 69.2 per hour in the supine position. Average oxygen saturation was 94%, nadir was 82%. Time below 90% saturation was 13 minutes. Based on his sleep related complaints and test results I invited him back for a full night CPAP titration study. He had this on 04/24/2016. Sleep efficiency was 65.7%, sleep latency 85.5 minutes, wake after sleep onset was 69 minutes with mild sleep fragmentation noted. He had inability to go back to sleep after 3:25 AM. Arousal index was normal. He had an increased  percentage of stage II sleep, absence of slow-wave sleep and a normal percentage of REM sleep with a normal REM latency. He had no significant PLMS, EKG or EEG changes. Snoring was eliminated. Average oxygen saturation was 95%, nadir was 92%. CPAP was started at 5 cm and titrated to a pressure of 6 cm. AHI was 0 per hour at the final pressure. Brief supine REM sleep was achieved. Based on his test results I prescribed home CPAP therapy at a pressure of 6 cm.   Today, 07/19/2016: I reviewed his CPAP compliance data from 06/18/2016 through 07/17/2016 which is a total of 30 days during which time he used his machine only 20 days with percent used days greater than 4 hours at 67%, indicating suboptimal compliance with an average usage for all nights of 4 hours and 50 minutes, residual AHI low at 0.5 per hour, leaked low with the 95th percentile at 9 L/m on a pressure of 6 cm without EPR.   Of note, his 70 day compliance from 05/09/2016 through 07/17/2016 showed a compliance percentage of 73%, adequate.  Today, 07/19/2016: He reports feeling better, sometimes still not fully rested. In the interim, he had presented to the emergency room on 05/31/2016. He was diagnosed with acute appendicitis and had lap appendectomy on 05/31/2016. Feels improved. With better sleep consolidation and less daytime tiredness and sleepiness. Nevertheless, he has occasional issues at night, sometimes he falls asleep on the couch and and not using his CPAP at night, he agrees that he can do  better with his sleep hygiene. On the positive side, he has reduced his soda intake. He is maintaining his weight, agrees that he could try to lose weight. Kids are getting ready to start third grade in first grade.  Previously:  04/11/2016: He reports snoring and excessive daytime somnolence as well as witnessed breathing pauses while asleep per wife.  His wife is a Marine scientist and has noticed apneic pauses while he is asleep and snoring is worse when  he is on his back. He tries to sleep on his sides. He tries to get enough sleep, bedtime around 9:30 to 10 PM and rise time around 6 AM. He denies morning headaches, restless leg symptoms or nocturia. He does not wake up rested. He is tired during the day, symptoms have been ongoing for at least 2 years. He has also slowly gained weight in the past 2 years or so. His father has obstructive sleep apnea and uses a CPAP machine. 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 He lives at home with his wife and 2 children, ages 75 and 80. He drinks 1-2 beer per day, does not use any illicit drugs, drinks about a soda per week, not much caffeine on a daily basis. He chews tobacco, one can per week. Sometimes he jerks his legs during sleep but typically this is with an apnea and with a snort. He is not known to twitch his legs regularly during his sleep. His Epworth sleepiness score is 11 out of 24 today, his fatigue score is 46 out of 63. I reviewed your office note from 04/05/2016.  His Past Medical History Is Significant For: Past Medical History:  Diagnosis Date  . Allergy   . Seasonal allergies   . Sleep apnea     His Past Surgical History Is Significant For: Past Surgical History:  Procedure Laterality Date  . BACK SURGERY    . CERVICAL FUSION  2008  . LAPAROSCOPIC APPENDECTOMY N/A 05/31/2016   Procedure: APPENDECTOMY LAPAROSCOPIC;  Surgeon: Autumn Messing III, MD;  Location: [facility];  Service: General;  Laterality: N/A;    His Family History Is Significant For: Family History  Problem Relation Age of Onset  . Diabetes Father   . Heart murmur Mother     His Social History Is Significant For: Social History   Social History  . Marital status: Married    Spouse name: N/A  . Number of children: 2  . Years of education: College   Occupational History  . [employer]    Social History Main Topics  . Smoking status: Never Smoker  . Smokeless tobacco: Current User    Types: Chew  .  Alcohol use 0.0 oz/week  . Drug use: No  . Sexual activity: No   Other Topics Concern  . None   Social History Narrative   Drinks 1 soda a week     His Allergies Are:  Allergies  Allergen Reactions  . Mushroom Ext Cmplx(Shiitake-Reishi-Mait) Diarrhea and Nausea And Vomiting  . Hydrocodone Other (See Comments)    Makes him aggressive/mean  :   His Current Medications Are:  Outpatient Encounter Prescriptions as of 07/19/2016  Medication Sig  . albuterol (PROVENTIL HFA;VENTOLIN HFA) 108 (90 Base) MCG/ACT inhaler Inhale 2 puffs into the lungs every 6 (six) hours as needed for wheezing or shortness of breath.  . Multiple Vitamins-Minerals (MULTIVITAMIN WITH MINERALS) tablet Take 1 tablet by mouth daily.  Marland Kitchen oxyCODONE-acetaminophen (PERCOCET/ROXICET) 5-325 MG tablet Take 1-2  tablets by mouth every 4 (four) hours as needed for moderate pain.   Facility-Administered Encounter Medications as of 07/19/2016  Medication  . iopamidol (ISOVUE-300) 61 % injection 100 mL  :  Review of Systems:  Out of a complete 14 point review of systems, all are reviewed and negative with the exception of these symptoms as listed below: Review of Systems  Neurological:       Patient reports that he is doing well with CPAP, no complaints or concerns.     Objective:  Neurologic Exam  Physical Exam Physical Examination:   Vitals:   07/19/16 0817  BP: 132/80  Pulse: 68  Resp: 18   General Examination: The patient is a very pleasant 45 y.o. male in no acute distress. He appears well-developed and well-nourished and very well groomed.   HEENT: Normocephalic, atraumatic, pupils are equal, round and reactive to light and accommodation. Extraocular tracking is good without limitation to gaze excursion or nystagmus noted. Normal smooth pursuit is noted. Hearing is grossly intact. Face is symmetric with normal facial animation and normal facial sensation. Speech is clear with no dysarthria noted. There is no  hypophonia. There is no lip, neck/head, jaw or voice tremor. Neck is supple with full range of passive and active motion. There are no carotid bruits on auscultation. Oropharynx exam reveals: mild mouth dryness, good dental hygiene and mild to moderate airway crowding, due to redundant soft palate and larger tongue. Tonsils are in place, about 1+ bilaterally. Mallampati is class II. Tongue protrudes centrally and palate elevates symmetrically.   Chest: Clear to auscultation without wheezing, rhonchi or crackles noted.  Heart: S1+S2+0, regular and normal without murmurs, rubs or gallops noted.   Abdomen: Soft, non-tender and non-distended with normal bowel sounds appreciated on auscultation.  Extremities: There is no pitting edema in the distal lower extremities bilaterally. Pedal pulses are intact.  Skin: Warm and dry without trophic changes noted. There are no varicose veins.  Musculoskeletal: exam reveals no obvious joint deformities, tenderness or joint swelling or erythema.   Neurologically:  Mental status: The patient is awake, alert and oriented in all 4 spheres. His immediate and remote memory, attention, language skills and fund of knowledge are appropriate. There is no evidence of aphasia, agnosia, apraxia or anomia. Speech is clear with normal prosody and enunciation. Thought process is linear. Mood is normal and affect is normal.  Cranial nerves II - XII are as described above under HEENT exam. In addition: shoulder shrug is normal with equal shoulder height noted. Motor exam: Normal bulk, strength and tone is noted. There is no drift, tremor or rebound. Romberg is negative. Reflexes are 2+ throughout. Babinski: Toes are flexor bilaterally. Fine motor skills and coordination: intact with normal finger taps, normal hand movements, normal rapid alternating patting, normal foot taps and normal foot agility.  Cerebellar testing: No dysmetria or intention tremor on finger to nose testing.  Heel to shin is unremarkable bilaterally. There is no truncal or gait ataxia.  Sensory exam: intact to light touch, pinprick, vibration, temperature sense in the upper and lower extremities.  Gait, station and balance: He stands easily. No veering to one side is noted. No leaning to one side is noted. Posture is age-appropriate and stance is narrow based. Gait shows normal stride length and normal pace. Tandem walk is unremarkable.   Assessment and Plan:  In summary, Jason Mack is a very pleasant 45 year old male with an underlying medical history of seasonal allergies, status post  cervical spine surgery in 2008, recent pneumonia, smokeless tobacco use, and obesity, who presents for follow-up consultation of his severe obstructive sleep apnea based on sleep study testing from May 2017. He has been on CPAP therapy since the end of May 2017 with fairly good compliance in the first month to month and a half, recent decline in compliance. In the interim, he had appendectomy in June 2017. Physical exam is otherwise stable. We talked about his baseline sleep study results as well as his CPAP titration study results in detail today. We also reviewed his 90 day and 30 day compliance data together. He is commended for trying to be compliant but encouraged to be fully compliant with CPAP therapy. I reviewed with him again the risks and ramifications of untreated moderate to severe obstructive sleep apnea, especially with respect to developing cardiovascular disease down the Road, including congestive heart failure, difficult to treat hypertension, cardiac arrhythmias, or stroke. Even type 2 diabetes has, in part, been linked to untreated OSA. Symptoms of untreated OSA include daytime sleepiness, memory problems, mood irritability and mood disorder such as depression and anxiety, lack of energy, as well as recurrent headaches, especially morning headaches. We talked about tobacco cessation and trying to maintain a  healthy lifestyle in general, as well as the importance of weight control. I encouraged the patient to eat healthy, exercise daily and keep well hydrated, to keep a scheduled bedtime and wake time routine, to not skip any meals and eat healthy snacks in between meals. I advised the patient not to drive when feeling sleepy. I explained the importance of being compliant with PAP treatment, not only for insurance purposes but primarily to improve His symptoms, and for the patient's long term health benefit, including to reduce His cardiovascular risks. I would like to see him back in 6 months, sooner as needed. Of note, he does not endorse any restless leg symptoms at this time and in fact feels that his leg twitching at night may have improved according to his wife's feedback.  I answered all his questions today and the patient was in agreement.   I spent 25 minutes in total face-to-face time with the patient, more than 50% of which was spent in counseling and coordination of care, reviewing test results, reviewing medication and discussing or reviewing the diagnosis of OSA, PLMs, the prognosis and treatment options.

## 2016-07-19 NOTE — Patient Instructions (Signed)

## 2016-08-08 DIAGNOSIS — G4733 Obstructive sleep apnea (adult) (pediatric): Secondary | ICD-10-CM | POA: Diagnosis not present

## 2016-08-11 DIAGNOSIS — G4733 Obstructive sleep apnea (adult) (pediatric): Secondary | ICD-10-CM | POA: Diagnosis not present

## 2016-09-08 DIAGNOSIS — G4733 Obstructive sleep apnea (adult) (pediatric): Secondary | ICD-10-CM | POA: Diagnosis not present

## 2016-09-13 IMAGING — CR DG CHEST 2V
2 series · 2 of 2 positions shown · non-contrast
Comparison: None.

CLINICAL DATA: Chest congestion, shortness of breath, fever for 1
day

EXAM:
CHEST  2 VIEW

[chest pa]
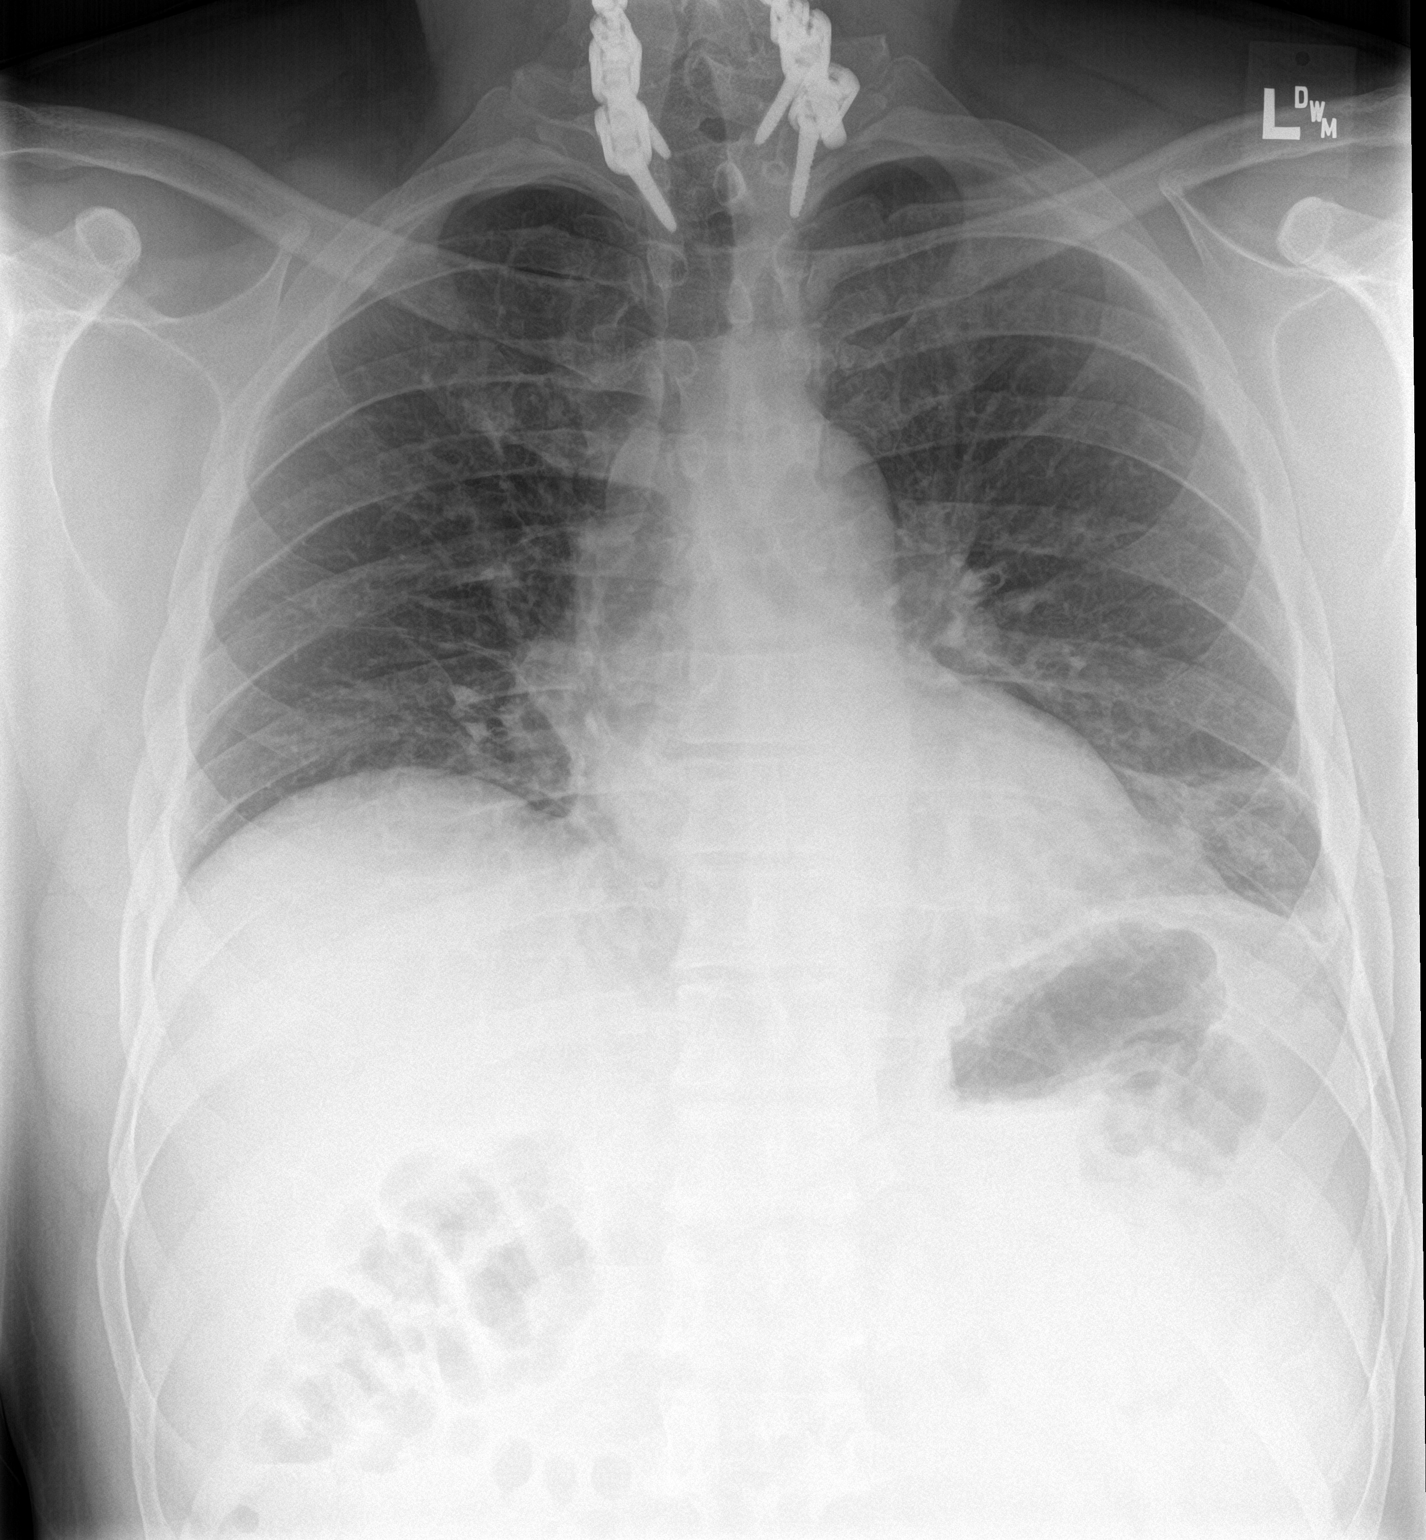

[chest lat]
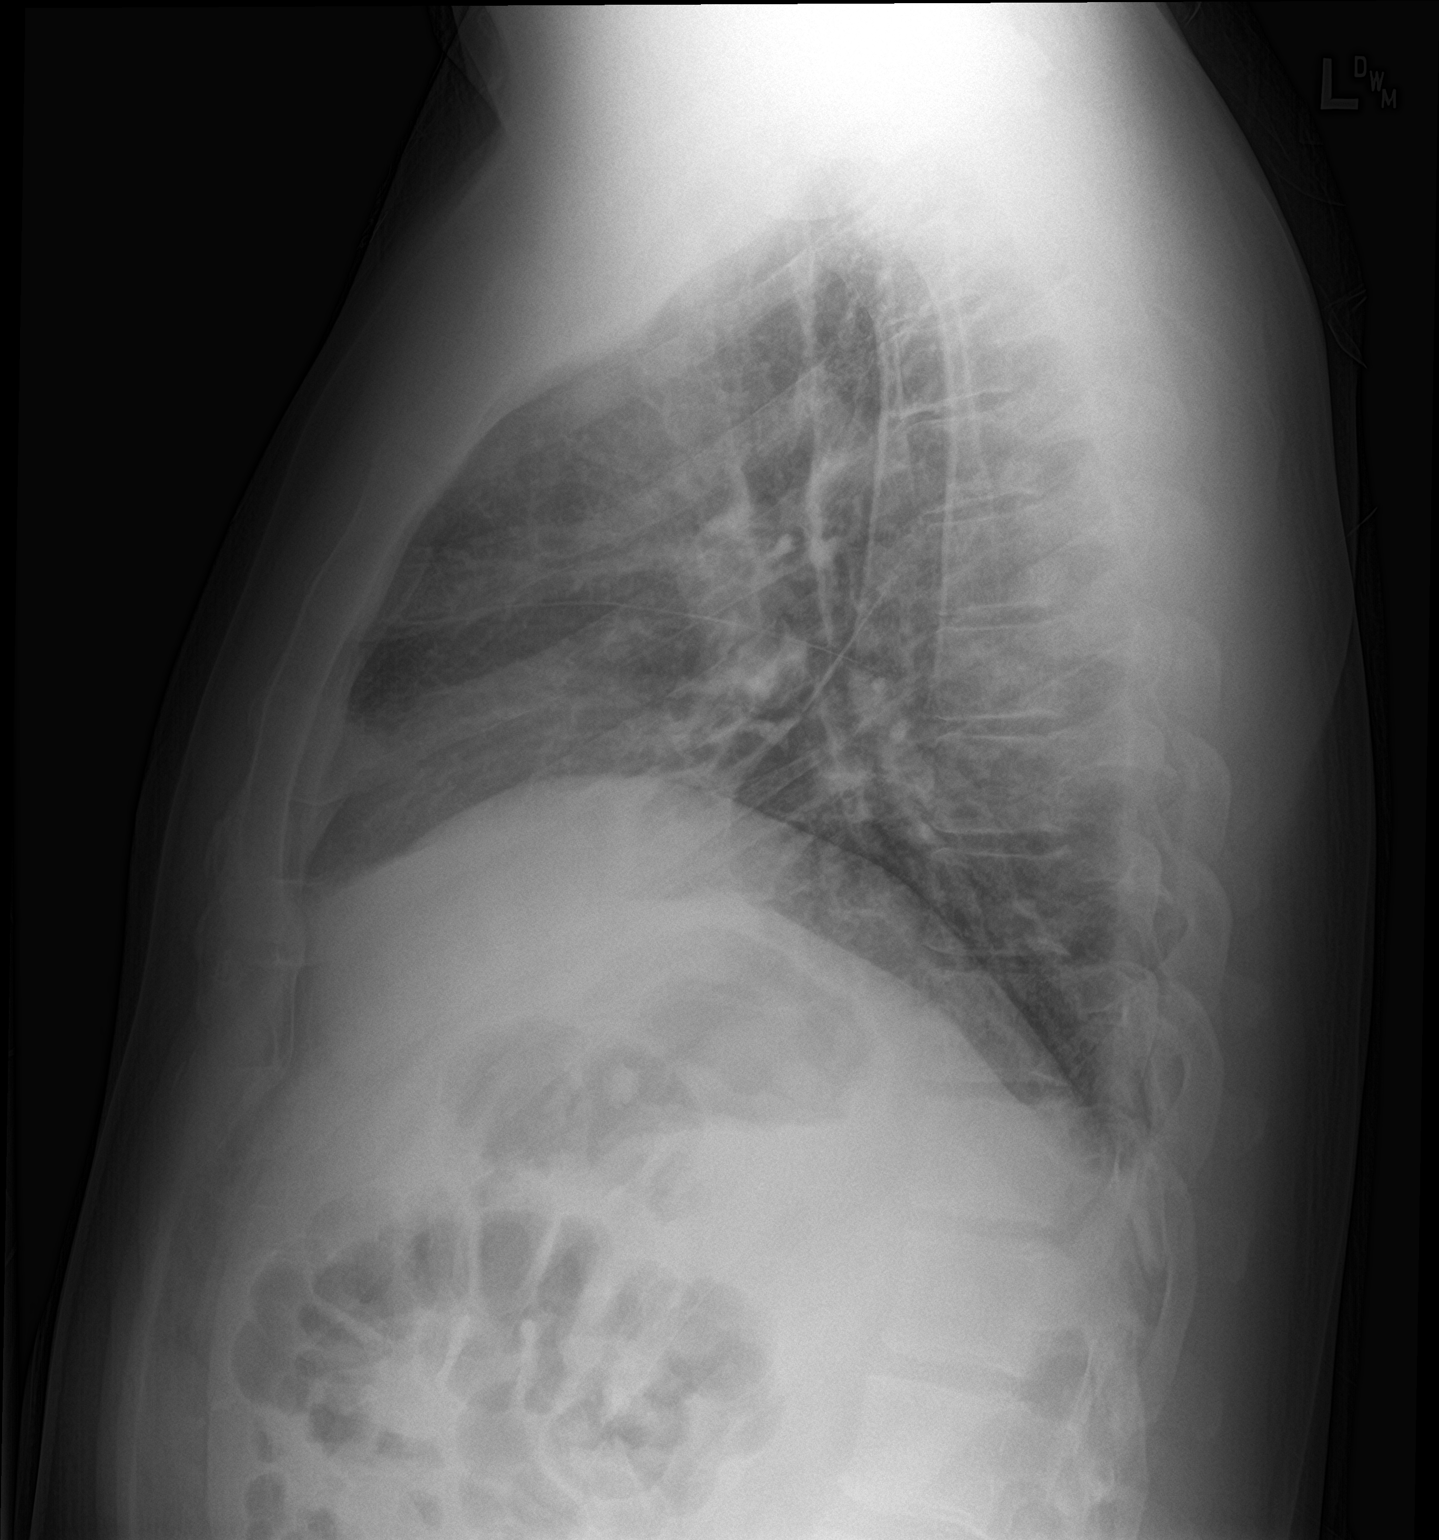

[2 of 2 positions shown; findings below may reference images not displayed]

FINDINGS: Airspace opacity at the left base concerning for lingular pneumonia.
No confluent opacity on the right. Heart is normal size. No effusion
or acute bony abnormality.
IMPRESSION: Lingular airspace opacity concerning for pneumonia.

## 2016-10-08 DIAGNOSIS — G4733 Obstructive sleep apnea (adult) (pediatric): Secondary | ICD-10-CM | POA: Diagnosis not present

## 2016-10-26 ENCOUNTER — Ambulatory Visit (INDEPENDENT_AMBULATORY_CARE_PROVIDER_SITE_OTHER): Payer: 59 | Admitting: Family Medicine

## 2016-10-26 ENCOUNTER — Encounter: Payer: Self-pay | Admitting: Family Medicine

## 2016-10-26 VITALS — BP 138/92 | HR 93 | Temp 98.4°F | Resp 18 | Ht 74.5 in | Wt 247.8 lb

## 2016-10-26 DIAGNOSIS — M79672 Pain in left foot: Secondary | ICD-10-CM

## 2016-10-26 DIAGNOSIS — R030 Elevated blood-pressure reading, without diagnosis of hypertension: Secondary | ICD-10-CM | POA: Diagnosis not present

## 2016-10-26 NOTE — Progress Notes (Signed)
Pre visit review using our clinic review tool, if applicable. No additional management support is needed unless otherwise documented below in the visit note. 

## 2016-10-26 NOTE — Progress Notes (Signed)
[place] at [facility] [address] [place], [place] [address] [phone]  Date:  10/26/2016   Name:  Jason Mack   DOB:  03-08-1971   MRN:  [id number]  PCP:  Lamar Blinks, MD    Chief Complaint: Plantar Facitis   History of Present Illness:  Jason Mack is a 45 y.o. very pleasant male patient who presents with the following:  Here today to discuss a problem with his left foot- he has had plantar fascitis since he was a teen. First dx at age 31, he went to the senior prom in a cast due to his sx.    He went to [employer] with his family in [place] February; this caused worsening of his symptoms and they have continued to be more bothersome for the last several months.   The sx are always in the left foot. The right foot is fine He generally can manage this with stretching, insoles, tennis ball massage, wearing a boot at night, stretching on the stairs.  However as of late these measures have failed to control his pain. It has been really painful at times and can make it hard for him to walk.  Otherwise he is not aware of any injury  He did have acute appendicitis and had surgery for same in June of this year.   He also had neck surgery years ago. Otherwise he has been in good health Never had trouble with HTN His 2 children are 7 and 51 yo   BP Readings from Last 3 Encounters:  10/26/16 (!) 160/109  07/19/16 132/80  06/01/16 134/70     Patient Active Problem List   Diagnosis Date Noted  . Acute appendicitis 05/31/2016  . KNEE PAIN, RIGHT 09/18/2010  . CONTUSION, LOWER LEG, LEFT 06/09/2010    Past Medical History:  Diagnosis Date  . Allergy   . Seasonal allergies   . Sleep apnea     Past Surgical History:  Procedure Laterality Date  . BACK SURGERY    . CERVICAL FUSION  2008  . LAPAROSCOPIC APPENDECTOMY N/A 05/31/2016   Procedure: APPENDECTOMY LAPAROSCOPIC;  Surgeon: Autumn Messing III, MD;  Location: [facility];  Service: General;  Laterality: N/A;    Social History  Substance Use Topics  . Smoking status: Never Smoker  . Smokeless tobacco: Current User    Types: Chew  . Alcohol use 0.0 oz/week    Family History  Problem Relation Age of Onset  . Diabetes Father   . Heart murmur Mother     Allergies  Allergen Reactions  . Mushroom Ext Cmplx(Shiitake-Reishi-Mait) Diarrhea and Nausea And Vomiting  . Hydrocodone Other (See Comments)    Makes him aggressive/mean    Medication list has been reviewed and updated.  Current Outpatient Prescriptions on File Prior to Visit  Medication Sig Dispense Refill  . albuterol (PROVENTIL HFA;VENTOLIN HFA) 108 (90 Base) MCG/ACT inhaler Inhale 2 puffs into the lungs every 6 (six) hours as needed for wheezing or shortness of breath. 1 Inhaler 0  . Multiple Vitamins-Minerals (MULTIVITAMIN WITH MINERALS) tablet Take 1 tablet by mouth daily.    Marland Kitchen oxyCODONE-acetaminophen (PERCOCET/ROXICET) 5-325 MG tablet Take 1-2 tablets by mouth every 4 (four) hours as needed for moderate pain. 40 tablet 0   Current Facility-Administered Medications on File Prior to Visit  Medication Dose Route Frequency Provider Last Rate Last Dose  . iopamidol (ISOVUE-300) 61 % injection 100 mL  100  mL Intravenous Once PRN Noland Fordyce, PA-C        Review of Systems: Foot pain- no hip or knee pain. No redness or swelling No fever, chills, nausea, vomiting or diarrheae As per HPI- otherwise negative.   Physical Examination: Vitals:   10/26/16 1652  BP: (!) 160/109  Pulse: 93  Resp: 18  Temp: 98.4 F (36.9 C)   Vitals:   10/26/16 1652  Weight: 247 lb 12.8 oz (112.4 kg)  Height: 6' 2.5" (1.892 m)   Body mass index is 31.39 kg/m. Ideal Body Weight: Weight in (lb) to have BMI = 25: 196.9  GEN: WDWN, NAD, Non-toxic, A & O x 3, looks well, tall/ large build HEENT: Atraumatic, Normocephalic. Neck supple. No masses, No LAD. Ears and Nose: No external deformity. CV: RRR,  No M/G/R. No JVD. No thrill. No extra heart sounds. PULM: CTA B, no wheezes, crackles, rhonchi. No retractions. No resp. distress. No accessory muscle use. EXTR: No c/c/e NEURO Normal gait.  PSYCH: Normally interactive. Conversant. Not depressed or anxious appearing.  Calm demeanor.  Left foot- normal foot shape and arch, NV intact. He has tenderness at the medial calcaneous at the attachment of the plantar fascia. Exam is suggestive of PF.  He does no otherwise have any bony tenderness or redness/ swelling of the fot   Assessment and Plan: Left foot pain - Plan: Ambulatory referral to Sports Medicine  Elevated blood pressure reading  Here today with persistent left foot pain for nearly 30 years.  He seems to have recurrent/ persistent plantar fascitis. Will refer to sports med for an opinion and treatment options.  He did have one steroid injection years ago and would be willing to do this again if needed.  Trial of scheduled NSAIDs for one week for the interim   BP is elevated today- this is not typical for him.  Will continue to monitor  Signed Lamar Blinks, MD

## 2016-10-26 NOTE — Patient Instructions (Signed)
I am going to refer you to a sports medicine doctor to check out your foot. In the meantime I would suggest that you take aleve or ibuprofen twice a day for one week.

## 2016-11-08 DIAGNOSIS — G4733 Obstructive sleep apnea (adult) (pediatric): Secondary | ICD-10-CM | POA: Diagnosis not present

## 2016-12-08 DIAGNOSIS — G4733 Obstructive sleep apnea (adult) (pediatric): Secondary | ICD-10-CM | POA: Diagnosis not present

## 2016-12-23 IMAGING — CT CT ABD-PELV W/ CM
2 of 5 series · 16 of 46 positions shown, 18 images · IV contrast (APPLIED)
Comparison: None

CLINICAL DATA: Epigastric pain and tenderness since 6866 hours,
nausea with 2 episodes of vomiting some abdominal burning, initial
encounter

EXAM:
CT ABDOMEN AND PELVIS WITH CONTRAST
TECHNIQUE: Multidetector CT imaging of the abdomen and pelvis was performed
using the standard protocol following bolus administration of
intravenous contrast. Sagittal and coronal MPR images reconstructed
from axial data set.
CONTRAST:  Dilute oral contrast.  100 cc Isovue 300 IV.

[Series 2: axial st · axial · 0.83mm/px · z∈[-546,-51]mm · 13 of 111 slices shown, 15 images]
[im 6/111  soft-tissue]
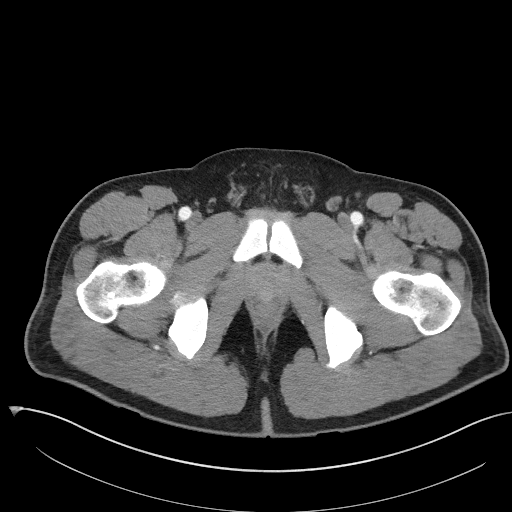
[im 6/111  bone]
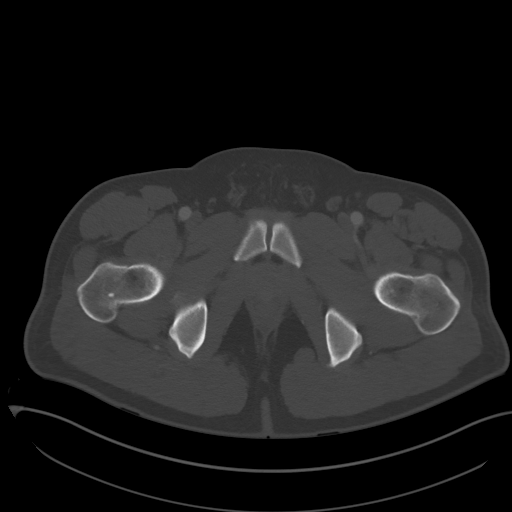
[im 17/111  soft-tissue]
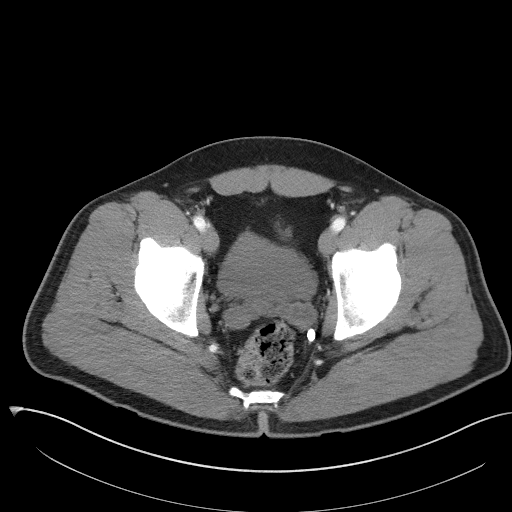
[im 23/111  soft-tissue]
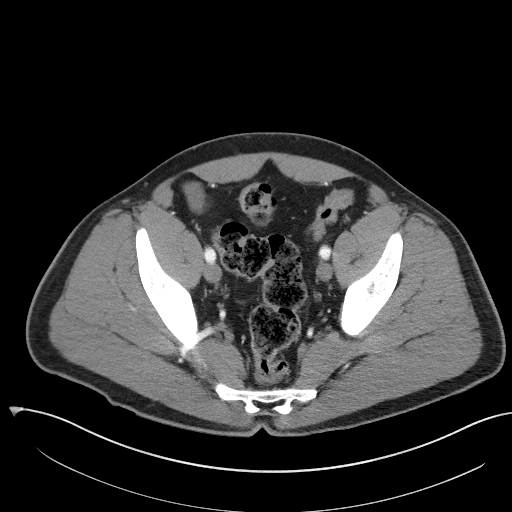
[im 34/111  soft-tissue]
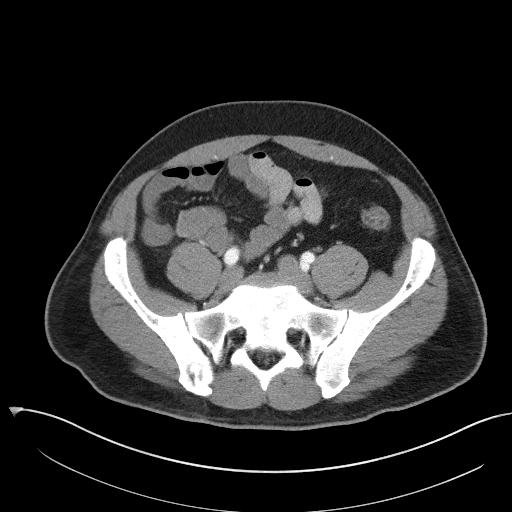
[im 39/111  soft-tissue]
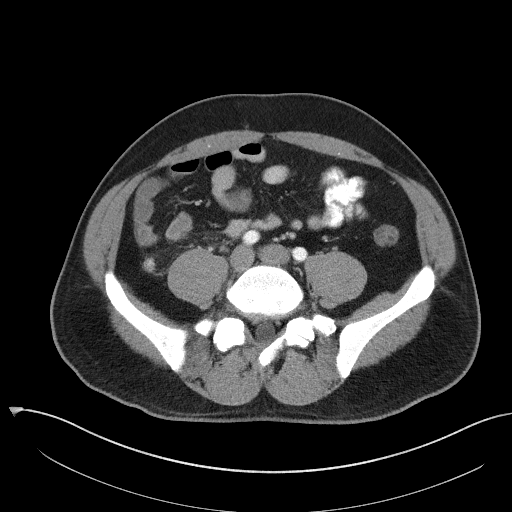
[im 50/111  soft-tissue]
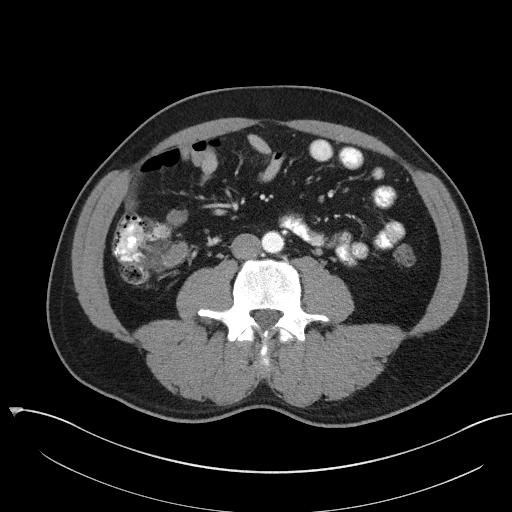
[im 56/111  soft-tissue]
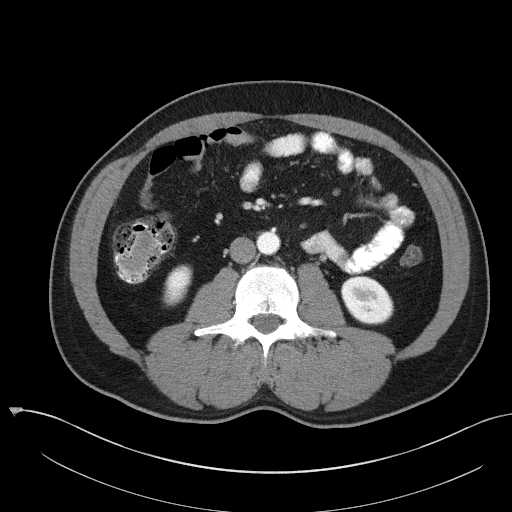
[im 61/111  soft-tissue]
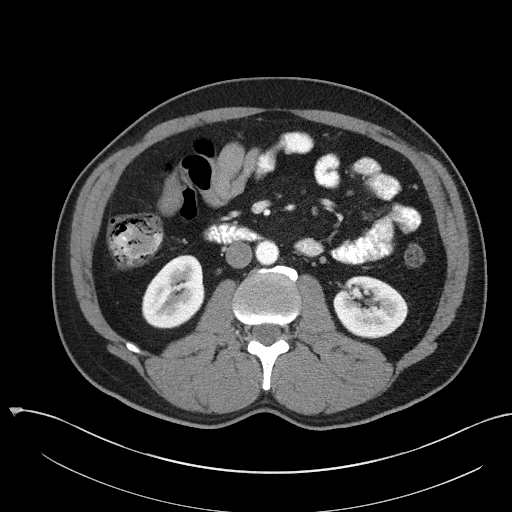
[im 72/111  soft-tissue]
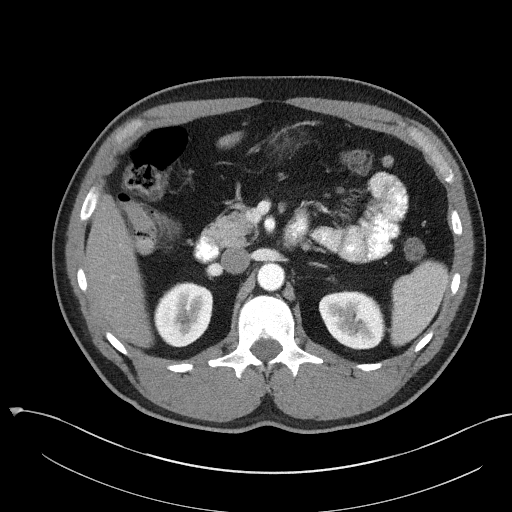
[im 72/111  bone]
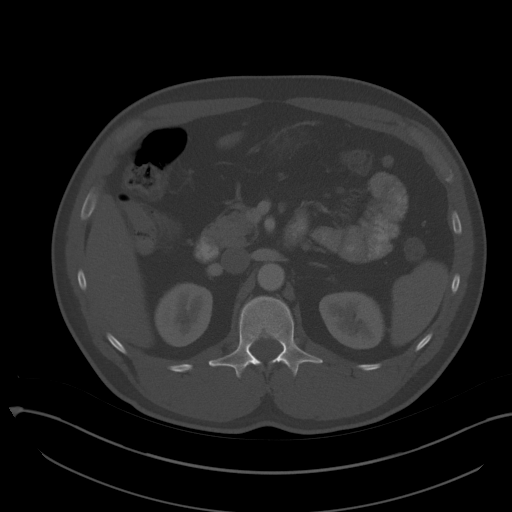
[im 78/111  soft-tissue]
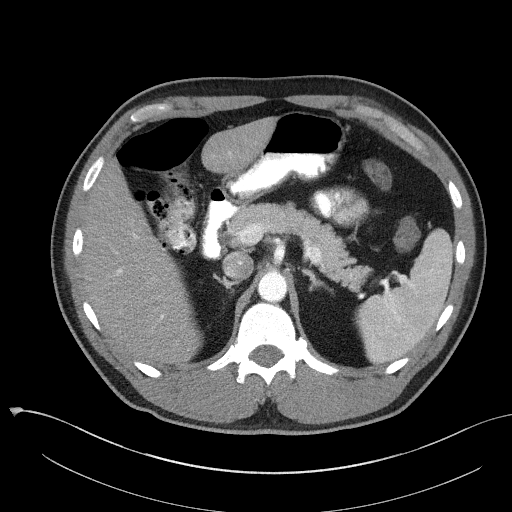
[im 89/111  soft-tissue]
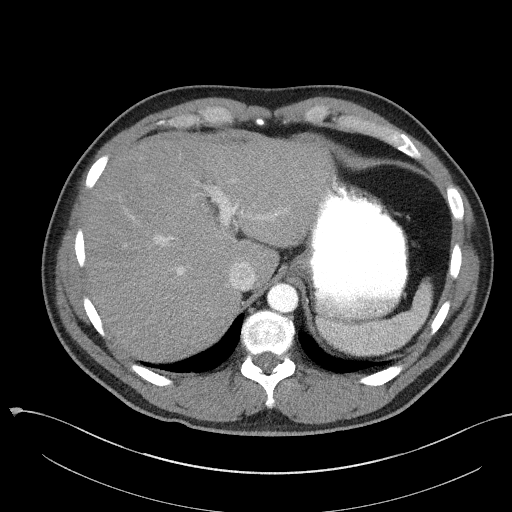
[im 94/111  soft-tissue]
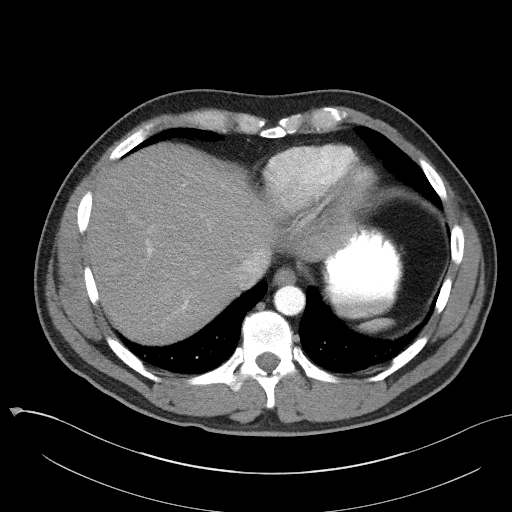
[im 105/111  soft-tissue]
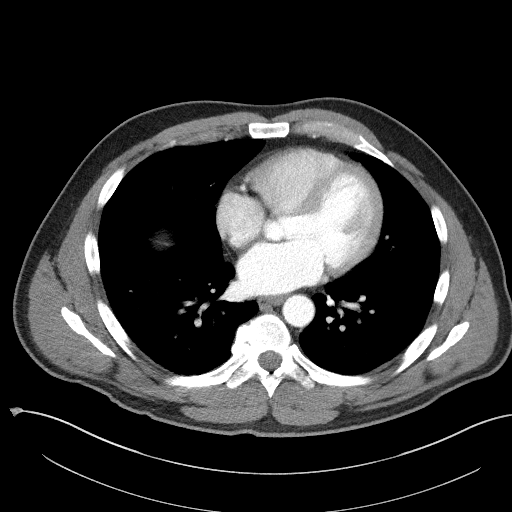

[Series 4: coronal st · coronal · 0.87mm/px · 3 of 105 slices shown]
[im 35/105  soft-tissue]
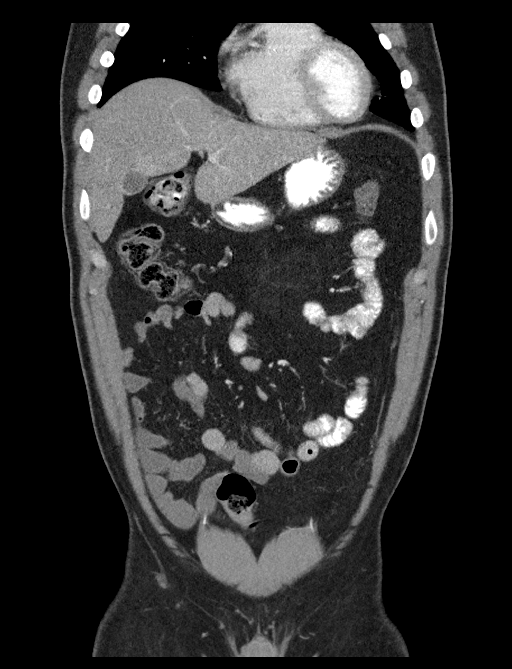
[im 47/105  soft-tissue]
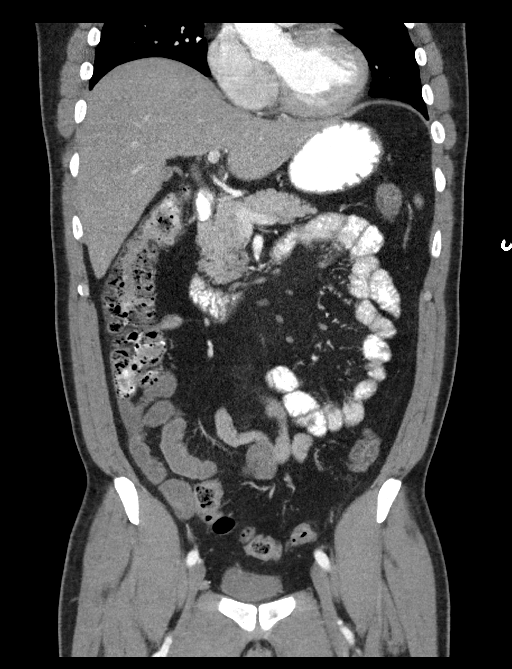
[im 58/105  soft-tissue]
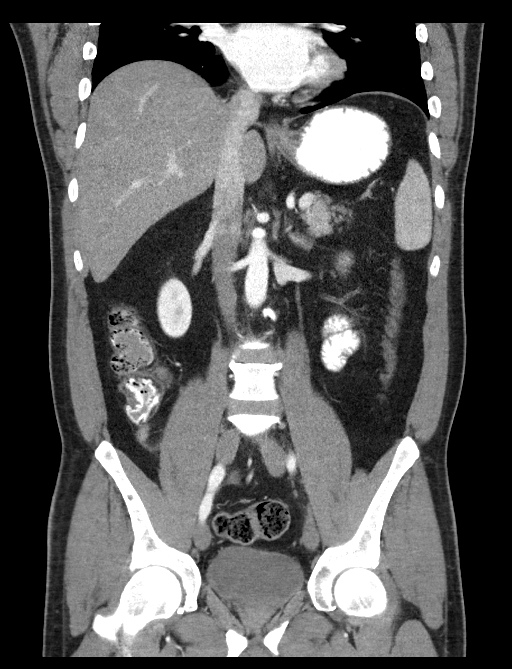

[16 of 46 positions shown; findings below may reference images not displayed]

FINDINGS: Lower chest:  Minimal dependent atelectasis at lung bases.

Hepatobiliary: Contracted gallbladder. Liver unremarkable. No
biliary dilatation.

Pancreas: Small lipoma at uncinate 12 x 7 mm image 41. Remainder of
pancreas normal appearance.

Spleen: Normal appearance

Adrenals/Urinary Tract: Adrenal glands normal appearance. Kidneys
normal appearance without mass or hydronephrosis. Ureters and
bladder normal appearance. Normal appearing prostate gland.

Stomach/Bowel: Enlarged appendix up to 10 mm diameter with small
appendicolith, mild thickening/enhancement of the appendiceal wall
and periappendiceal inflammatory changes compatible with acute
appendicitis. No evidence of perforation or abscess. Stomach and
bowel loops normal appearance.

Vascular/Lymphatic: Scattered phleboliths. Vascular structures
patent. No adenopathy.

Reproductive: Minimally prominent seminal vesicles without discrete
mass.

Other: No free air, free fluid or mass. Minimal infiltration of
small bowel mesenteries which may represent minimal fibrosing
mesenteritis. No hernia.

Musculoskeletal: Osseous structures unremarkable.
IMPRESSION: Acute appendicitis.

Small lipoma at uncinate process pancreas.

Findings called to Mellissa Niemeyer PA on 05/31/2014 at 1262 hr.

## 2017-01-22 ENCOUNTER — Ambulatory Visit (INDEPENDENT_AMBULATORY_CARE_PROVIDER_SITE_OTHER): Payer: 59 | Admitting: Neurology

## 2017-01-22 ENCOUNTER — Encounter: Payer: Self-pay | Admitting: Neurology

## 2017-01-22 VITALS — BP 137/92 | HR 66 | Resp 20 | Ht 70.5 in | Wt 245.0 lb

## 2017-01-22 DIAGNOSIS — Z9989 Dependence on other enabling machines and devices: Secondary | ICD-10-CM

## 2017-01-22 DIAGNOSIS — G4733 Obstructive sleep apnea (adult) (pediatric): Secondary | ICD-10-CM | POA: Diagnosis not present

## 2017-01-22 NOTE — Patient Instructions (Signed)

## 2017-01-22 NOTE — Progress Notes (Signed)
Subjective:    Patient ID: Jason Mack is a 46 y.o. male.  HPI     Interim history:   Jason Mack is a 46 year old right-handed gentleman with an underlying medical history of seasonal allergies, status post cervical spine surgery in 2008 (due to Fx cervical vertebrae), recent pneumonia, smokeless tobacco use, and obesity, who presents for follow-up consultation of his obstructive sleep apnea, on CPAP therapy. The patient is unaccompanied today. I last saw him on 07/19/2016, at which time we discussed his sleep study results from his baseline sleep study from 04/14/2016 as well as his CPAP titration study from 04/24/2016. We talked about his CPAP usage. He was adequate with his compliance, reported feeling better but not always fully rested. He had presented to the emergency room in June 2017 and was diagnosed with acute appendicitis and had appendectomy on 05/31/2016. He was trying to eat better and reduce his caloric intake.  Today, 01/22/2017 (all dictated new, as well as above notes, some dictation done in note pad or Word, outside of chart, may appear as copied): I reviewed his CPAP compliance data from 12/18/2016 through 01/16/2017 which is a total of 30 days, during which time he used his CPAP 28 days with percent used days greater than 4 hours at 87%, indicating very good compliance with an average usage of 6 hours and 46 minutes, residual AHI low at 0.5 per hour, leak acceptable with the 95th percentile at 7.9 L/m on a pressure of 6 cm. He reports doing well overall, sometimes his allergies flare up and his congestion makes it difficult or impossible for him to use his CPAP. He uses nasal pillows. He has changed his pillow inserts and his filter is in a regular basis. He has no new concerns. He has tried a fullface mask but could not tolerate it. Occasionally he has nosebleeds on the right. He does use an over-the-counter allergy medicine or nasal saline spray but not the nasal rinse. Has had  interim issues with plantar fasciitis, saw his PCP for this in November 2017. He was supposed to see a foot doctor but has not gotten around to doing it.  The patient's allergies, current medications, family history, past medical history, past social history, past surgical history and problem list were reviewed and updated as appropriate.   Previously (copied from previous notes for reference):    I first met him on 04/11/2016 at the request of his primary care physician, at which time he reported snoring and excessive daytime somnolence as well as witnessed apneas per wife. I invited him back for sleep study. He had a baseline sleep study, followed by a CPAP titration study. I talked to him about his test results in detail today. Baseline sleep study from 04/14/2016 showed a sleep efficiency of 62.3%, latency to sleep was 10.5 minutes but wake after sleep onset was 166.5 minutes with moderate sleep fragmentation noted. He had an elevated arousal index. He had an increased percentage of light stage sleep, absence of slow-wave sleep, and a decreased percentage of REM sleep at 7.4% with a prolonged REM latency. He had mild PLMS at 10.7 per hour, resulting in 3.7 arousals per hour. He had no significant EEG or EKG changes. He had mild to moderate and at times loud snoring. Total AHI was elevated at 33 per hour, rising to 53 per hour during REM sleep and 69.2 per hour in the supine position. Average oxygen saturation was 94%, nadir was 82%. Time below 90% saturation  was 13 minutes. Based on his sleep related complaints and test results I invited him back for a full night CPAP titration study. He had this on 04/24/2016. Sleep efficiency was 65.7%, sleep latency 85.5 minutes, wake after sleep onset was 69 minutes with mild sleep fragmentation noted. He had inability to go back to sleep after 3:25 AM. Arousal index was normal. He had an increased percentage of stage II sleep, absence of slow-wave sleep and a normal  percentage of REM sleep with a normal REM latency. He had no significant PLMS, EKG or EEG changes. Snoring was eliminated. Average oxygen saturation was 95%, nadir was 92%. CPAP was started at 5 cm and titrated to a pressure of 6 cm. AHI was 0 per hour at the final pressure. Brief supine REM sleep was achieved. Based on his test results I prescribed home CPAP therapy at a pressure of 6 cm.    I reviewed his CPAP compliance data from 06/18/2016 through 07/17/2016 which is a total of 30 days during which time he used his machine only 20 days with percent used days greater than 4 hours at 67%, indicating suboptimal compliance with an average usage for all nights of 4 hours and 50 minutes, residual AHI low at 0.5 per hour, leaked low with the 95th percentile at 9 L/m on a pressure of 6 cm without EPR.    Of note, his 70 day compliance from 05/09/2016 through 07/17/2016 showed a compliance percentage of 73%, adequate.   04/11/2016: He reports snoring and excessive daytime somnolence as well as witnessed breathing pauses while asleep per wife.  His wife is a Marine scientist and has noticed apneic pauses while he is asleep and snoring is worse when he is on his back. He tries to sleep on his sides. He tries to get enough sleep, bedtime around 9:30 to 10 PM and rise time around 6 AM. He denies morning headaches, restless leg symptoms or nocturia. He does not wake up rested. He is tired during the day, symptoms have been ongoing for at least 2 years. He has also slowly gained weight in the past 2 years or so. His father has obstructive sleep apnea and uses a CPAP machine. 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 He lives at home with his wife and 2 children, ages 33 and 63. He drinks 1-2 beer per day, does not use any illicit drugs, drinks about a soda per week, not much caffeine on a daily basis. He chews tobacco, one can per week. Sometimes he jerks his legs during sleep but typically this is with an apnea  and with a snort. He is not known to twitch his legs regularly during his sleep. His Epworth sleepiness score is 11 out of 24 today, his fatigue score is 46 out of 63. I reviewed your office note from 04/05/2016.  His Past Medical History Is Significant For: Past Medical History:  Diagnosis Date  . Allergy   . Seasonal allergies   . Sleep apnea     His Past Surgical History Is Significant For: Past Surgical History:  Procedure Laterality Date  . BACK SURGERY    . CERVICAL FUSION  2008  . LAPAROSCOPIC APPENDECTOMY N/A 05/31/2016   Procedure: APPENDECTOMY LAPAROSCOPIC;  Surgeon: Autumn Messing III, MD;  Location: [facility];  Service: General;  Laterality: N/A;    His Family History Is Significant For: Family History  Problem Relation Age of Onset  . Diabetes Father   .  Heart murmur Mother     His Social History Is Significant For: Social History   Social History  . Marital status: Married    Spouse name: N/A  . Number of children: 2  . Years of education: College   Occupational History  . [employer]    Social History Main Topics  . Smoking status: Never Smoker  . Smokeless tobacco: Current User    Types: Chew  . Alcohol use 0.0 oz/week  . Drug use: No  . Sexual activity: No   Other Topics Concern  . None   Social History Narrative   Drinks 1 soda a week     His Allergies Are:  Allergies  Allergen Reactions  . Mushroom Ext Cmplx(Shiitake-Reishi-Mait) Diarrhea and Nausea And Vomiting  . Hydrocodone Other (See Comments)    Makes him aggressive/mean  :   His Current Medications Are:  Outpatient Encounter Prescriptions as of 01/22/2017  Medication Sig  . [DISCONTINUED] albuterol (PROVENTIL HFA;VENTOLIN HFA) 108 (90 Base) MCG/ACT inhaler Inhale 2 puffs into the lungs every 6 (six) hours as needed for wheezing or shortness of breath.  . [DISCONTINUED] Multiple Vitamins-Minerals (MULTIVITAMIN WITH MINERALS) tablet Take 1 tablet by mouth daily.  . [DISCONTINUED]  oxyCODONE-acetaminophen (PERCOCET/ROXICET) 5-325 MG tablet Take 1-2 tablets by mouth every 4 (four) hours as needed for moderate pain.   Facility-Administered Encounter Medications as of 01/22/2017  Medication  . iopamidol (ISOVUE-300) 61 % injection 100 mL  :  Review of Systems:  Out of a complete 14 point review of systems, all are reviewed and negative with the exception of these symptoms as listed below: Review of Systems  Neurological:       Pt presents today to discuss his cpap compliance. Pt reports that other than an occasional stuffy nose, he is doing well on cpap.    Objective:  Neurologic Exam  Physical Exam Physical Examination:   Vitals:   01/22/17 0831  BP: (!) 137/92  Pulse: 66  Resp: 20   General Examination: The patient is a very pleasant 46 y.o. male in no acute distress. He appears well-developed and well-nourished and well groomed.   HEENT: Normocephalic, atraumatic, pupils are equal, round and reactive to light and accommodation. Extraocular tracking is good without limitation to gaze excursion or nystagmus noted. Normal smooth pursuit is noted. Hearing is grossly intact. Face is symmetric with normal facial animation and normal facial sensation. Speech is clear with no dysarthria noted. There is no hypophonia. There is no lip, neck/head, jaw or voice tremor. Neck is supple with full range of passive and active motion. There are no carotid bruits on auscultation. Oropharynx exam reveals: mild mouth dryness, good/adequate dental hygiene and mild to mod airway crowding. Mallampati is class II. Tongue protrudes centrally and palate elevates symmetrically. Tonsils are 1+ in size. Nasal inspection reveals no significant nasal mucosal bogginess or redness and no septal deviation, there is slight redness noted at the medial right nostril .   Chest: Clear to auscultation without wheezing, rhonchi or crackles noted.  Heart: S1+S2+0, regular and normal without murmurs, rubs  or gallops noted.   Abdomen: Soft, non-tender and non-distended with normal bowel sounds appreciated on auscultation.  Extremities: There is no pitting edema in the distal lower extremities bilaterally. Pedal pulses are intact.  Skin: Warm and dry without trophic changes noted.  Musculoskeletal: exam reveals no obvious joint deformities, tenderness or joint swelling or erythema.   Neurologically:  Mental status: The patient is awake, alert and oriented in  all 4 spheres. His immediate and remote memory, attention, language skills and fund of knowledge are appropriate. There is no evidence of aphasia, agnosia, apraxia or anomia. Speech is clear with normal prosody and enunciation. Thought process is linear. Mood is normal and affect is normal.  Cranial nerves II - XII are as described above under HEENT exam. In addition: shoulder shrug is normal with equal shoulder height noted. Motor exam: Normal bulk, strength and tone is noted. There is no drift, tremor or rebound. Romberg is negative. Reflexes are 1-2+ throughout. Fine motor skills and coordination: intact.  Cerebellar testing: No dysmetria or intention tremor.  Sensory exam: intact to light touch in the upper and lower extremities.  Gait, station and balance: He stands easily. No veering to one side is noted. No leaning to one side is noted. Posture is age-appropriate and stance is narrow based. Gait shows decreased stride length and normal pace. Tandem walk is unremarkable.   Assessment and Plan:   In summary, Jason Mack is a very pleasant 46 y.o.-year old male withAn underlying medical history of allergies, mostly seasonal allergies, neck surgery in 2008, history of pneumonia and smokeless tobacco use, as well as obesity and borderline blood pressure values, who presents for follow-up consultation of his OSA. He had baseline sleep study testing in May 2017. We briefly reviewed his test results again today. We reviewed his most recent  compliance data. He had appendectomy in June 2017 and had some decline in his CPAP usage. He is compliant with treatment at this time and commended for this. His weight has been fluctuating, currently a little up. He is encouraged to work on weight loss. Physical exam is stable otherwise. He is advised to follow-up in one year, he can see one of our nurse practitioners at the time and I will see him back after that. I answered all his questions today and he was in agreement.  I spent 15 minutes in total face-to-face time with the patient, more than 50% of which was spent in counseling and coordination of care, reviewing test results, reviewing medication and discussing or reviewing the diagnosis of OSA, its prognosis and treatment options. Pertinent laboratory and imaging test results that were available during this visit with the patient were reviewed by me and considered in my medical decision making (see chart for details).

## 2017-07-09 MED FILL — AMOX-CLAV 875-125 MG TABLET: 875-125 | 10 days supply | Qty: 20 | Fill #0

## 2017-07-09 MED FILL — predniSONE 20 MG TABS: 20 | 4 days supply | Qty: 8 | Fill #0

## 2017-08-10 MED FILL — TOBRAMYCIN 0.3% EYE DROPS: 0.3 | 17 days supply | Qty: 5 | Fill #0

## 2017-11-14 MED FILL — CYCLOBENZAPRINE 5 MG TABLET: 5 | 10 days supply | Qty: 30 | Fill #0

## 2018-01-22 ENCOUNTER — Ambulatory Visit: Payer: 59 | Admitting: Adult Health

## 2018-01-30 MED FILL — OSELTAMIVIR PHOSPHATE 75 MG: 75 | 5 days supply | Qty: 10 | Fill #0

## 2018-01-30 MED FILL — predniSONE 20 MG TABS: 20 | 5 days supply | Qty: 10 | Fill #0

## 2018-12-18 MED FILL — BENZONATATE 100 MG CAP: 100 | 10 days supply | Qty: 30 | Fill #0

## 2018-12-18 MED FILL — HYDROCODONE-HOMATROPINE SOL: 5-1.5 | 6 days supply | Qty: 120 | Fill #0

## 2018-12-18 MED FILL — AMOX-CLAV 875-125 MG TABLET: 875-125 | 7 days supply | Qty: 14 | Fill #0

## 2021-01-23 ENCOUNTER — Other Ambulatory Visit: Payer: Self-pay

## 2021-01-23 ENCOUNTER — Emergency Department
Admission: EM | Admit: 2021-01-23 | Discharge: 2021-01-23 | Disposition: A | Discharge status: Home / Self Care | Payer: BC Managed Care – PPO | Source: Home / Self Care | Attending: Family Medicine | Admitting: Family Medicine

## 2021-01-23 DIAGNOSIS — R55 Syncope and collapse: Secondary | ICD-10-CM

## 2021-01-23 DIAGNOSIS — M25571 Pain in right ankle and joints of right foot: Secondary | ICD-10-CM | POA: Diagnosis not present

## 2021-01-23 DIAGNOSIS — M109 Gout, unspecified: Secondary | ICD-10-CM

## 2021-01-23 MED ORDER — KETOROLAC TROMETHAMINE 60 MG/2ML IM SOLN
60.0000 mg | Freq: Once | INTRAMUSCULAR | Status: AC
  Administered 2021-01-23: 60 mg via INTRAMUSCULAR

## 2021-01-23 MED ORDER — PREDNISONE 10 MG (21) PO TBPK: ORAL_TABLET | Freq: Every day | ORAL | 0 refills | Status: AC

## 2021-01-23 MED ORDER — KETOROLAC TROMETHAMINE 60 MG/2ML IM SOLN: 60.0000 mg | Freq: Once | INTRAMUSCULAR | Status: DC

## 2021-01-23 NOTE — ED Provider Notes (Addendum)
[facility]    CSN: [id number] Arrival date & time: 01/23/21  8099      History   Chief Complaint Chief Complaint  Patient presents with  . Foot Pain    HPI Jason Mack is a 50 y.o. male.   HPI  Patient has known gout.  His last flare was 1 year ago.  He has noticed that his right foot is becoming increasingly painful over the last 2 to 3 days.  It is red.  It swollen.  He can hardly get into a wide shoe. No trauma or injury No change in diet  Past Medical History:  Diagnosis Date  . Allergy   . Seasonal allergies   . Sleep apnea     Patient Active Problem List   Diagnosis Date Noted  . Acute appendicitis 05/31/2016  . KNEE PAIN, RIGHT 09/18/2010  . CONTUSION, LOWER LEG, LEFT 06/09/2010    Past Surgical History:  Procedure Laterality Date  . BACK SURGERY    . CERVICAL FUSION  2008  . LAPAROSCOPIC APPENDECTOMY N/A 05/31/2016   Procedure: APPENDECTOMY LAPAROSCOPIC;  Surgeon: Autumn Messing III, MD;  Location: [facility];  Service: General;  Laterality: N/A;  . SHOULDER SURGERY         Home Medications    Prior to Admission medications   Medication Sig Start Date End Date Taking? Authorizing Provider  predniSONE (STERAPRED UNI-PAK 21 TAB) 10 MG (21) TBPK tablet Take by mouth daily. tad 01/23/21  Yes Raylene Everts, MD    Family History Family History  Problem Relation Age of Onset  . Diabetes Father   . Heart murmur Mother     Social History Social History   Tobacco Use  . Smoking status: Never Smoker  . Smokeless tobacco: Current User    Types: Chew  Substance Use Topics  . Alcohol use: Yes    Alcohol/week: 0.0 standard drinks  . Drug use: No     Allergies   Mushroom ext cmplx(shiitake-reishi-mait) and Hydrocodone   Review of Systems Review of Systems See HPI  Physical Exam Triage Vital Signs ED Triage Vitals  Enc Vitals Group     BP 01/23/21 1023 (!) 87/56     Pulse Rate 01/23/21 1023 (!) 112     Resp 01/23/21 1023 20      Temp 01/23/21 1023 98.1 F (36.7 C)     Temp Source 01/23/21 1023 Oral     SpO2 01/23/21 1023 96 %     Weight 01/23/21 1020 243 lb (110.2 kg)     Height 01/23/21 1020 6\' 2"  (1.88 m)     Head Circumference --      Peak Flow --      Pain Score 01/23/21 1020 10     Pain Loc --      Pain Edu? --      Excl. in [place]? --    No data found.  Updated Vital Signs BP 121/87   Pulse 71   Temp 98.1 F (36.7 C) (Oral)   Resp 20   Ht 6\' 2"  (1.88 m)   Wt 110.2 kg   SpO2 100%   BMI 31.20 kg/m   Physical Exam Vitals and nursing note reviewed.  Constitutional:      General: He is not in acute distress.    Appearance: He is well-developed and well-nourished.  HENT:     Head: Normocephalic and atraumatic.     Mouth/Throat:     Mouth: Oropharynx is  clear and moist.  Eyes:     Conjunctiva/sclera: Conjunctivae normal.     Pupils: Pupils are equal, round, and reactive to light.  Cardiovascular:     Rate and Rhythm: Normal rate.  Pulmonary:     Effort: Pulmonary effort is normal. No respiratory distress.  Abdominal:     General: There is no distension.     Palpations: Abdomen is soft.  Musculoskeletal:        General: No edema. Normal range of motion.     Cervical back: Normal range of motion.     Comments: Great toe MTP joint is red, swollen, very painful to touch.  Pain with toe movement.  Right foot  Skin:    General: Skin is warm and dry.  Neurological:     General: No focal deficit present.     Mental Status: He is alert.  Psychiatric:        Mood and Affect: Mood normal.        Behavior: Behavior normal.   During evaluation patient had a vasovagal episode.  Dropped his blood pressure into the 80s over 50s.  Tachycardia.  After rest and hydration he recovered.  He has never had this reaction before.  Feels well after 20 minutes   UC Treatments / Results  Labs (all labs ordered are listed, but only abnormal results are displayed) Labs Reviewed - No data to  display  EKG   Radiology No results found.  Procedures Procedures (including critical care time)  Medications Ordered in UC Medications  ketorolac (TORADOL) injection 60 mg (has no administration in time range)  ketorolac (TORADOL) injection 60 mg (60 mg Intramuscular Given 01/23/21 1039)    Initial Impression / Assessment and Plan / UC Course  I have reviewed the triage vital signs and the nursing notes.  Pertinent labs & imaging results that were available during my care of the patient were reviewed by me and considered in my medical decision making (see chart for details).      Final Clinical Impressions(s) / UC Diagnoses   Final diagnoses:  Pain in joint involving right ankle and foot  Acute gout involving toe of right foot, unspecified cause  Vasovagal episode     Discharge Instructions     Limit walking Take prednisone as directed.  Take all of day 1 today Drink lots of water See your primary care doctor about gout management     ED Prescriptions    Medication Sig Dispense Auth. Provider   predniSONE (STERAPRED UNI-PAK 21 TAB) 10 MG (21) TBPK tablet Take by mouth daily. tad 21 tablet Raylene Everts, MD     PDMP not reviewed this encounter.   Raylene Everts, MD 01/23/21 1100    Raylene Everts, MD 01/23/21 1102

## 2021-01-23 NOTE — Discharge Instructions (Addendum)
Limit walking Take prednisone as directed.  Take all of day 1 today Drink lots of water See your primary care doctor about gout management

## 2021-01-23 NOTE — ED Triage Notes (Signed)
Pt states that he has some right foot pain. x1 day

## 2021-01-23 NOTE — ED Notes (Signed)
Pt states he is feeling better, in lying position. Pt sat up w/o feeling faint. VSS. Advised to stay in sitting position for now.

## 2021-04-13 ENCOUNTER — Other Ambulatory Visit (HOSPITAL_BASED_OUTPATIENT_CLINIC_OR_DEPARTMENT_OTHER): Payer: Self-pay

## 2021-04-13 MED ORDER — ALLOPURINOL 100 MG PO TABS
ORAL_TABLET | ORAL | 1 refills | Status: AC
  Filled 2021-04-13: qty 90, 90d supply, fill #0

## 2021-04-18 ENCOUNTER — Other Ambulatory Visit (HOSPITAL_BASED_OUTPATIENT_CLINIC_OR_DEPARTMENT_OTHER): Payer: Self-pay

## 2021-12-20 ENCOUNTER — Other Ambulatory Visit (HOSPITAL_BASED_OUTPATIENT_CLINIC_OR_DEPARTMENT_OTHER): Payer: Self-pay

## 2021-12-20 MED ORDER — AMOXICILLIN-POT CLAVULANATE 875-125 MG PO TABS
ORAL_TABLET | ORAL | 0 refills | Status: AC
  Filled 2021-12-20: qty 20, 10d supply, fill #0

## 2021-12-20 MED ORDER — PREDNISONE 20 MG PO TABS
ORAL_TABLET | ORAL | 0 refills | Status: AC
  Filled 2021-12-20: qty 9, 7d supply, fill #0

## 2023-08-02 ENCOUNTER — Other Ambulatory Visit (HOSPITAL_BASED_OUTPATIENT_CLINIC_OR_DEPARTMENT_OTHER): Payer: Self-pay

## 2023-08-02 MED ORDER — PANTOPRAZOLE SODIUM 20 MG PO TBEC
20.0000 mg | DELAYED_RELEASE_TABLET | Freq: Two times a day (BID) | ORAL | 0 refills | Status: AC
  Filled 2023-08-02: qty 60, 30d supply, fill #0

## 2023-08-24 ENCOUNTER — Other Ambulatory Visit (HOSPITAL_BASED_OUTPATIENT_CLINIC_OR_DEPARTMENT_OTHER): Payer: Self-pay

## 2023-08-24 MED ORDER — FLUCONAZOLE 100 MG PO TABS
100.0000 mg | ORAL_TABLET | Freq: Every day | ORAL | 0 refills | Status: AC
  Filled 2023-08-24: qty 22, 21d supply, fill #0

## 2023-09-04 ENCOUNTER — Other Ambulatory Visit (HOSPITAL_BASED_OUTPATIENT_CLINIC_OR_DEPARTMENT_OTHER): Payer: Self-pay
